# Patient Record
Sex: Female | Born: 1960 | Hispanic: No | Smoking: Never smoker
Health system: Southern US, Community
[De-identification: ages and names within clinical notes are randomized; demographics above are authoritative.]

---

## 2020-06-10 ENCOUNTER — Other Ambulatory Visit: Payer: Self-pay

## 2020-06-10 ENCOUNTER — Ambulatory Visit (INDEPENDENT_AMBULATORY_CARE_PROVIDER_SITE_OTHER): Payer: PRIVATE HEALTH INSURANCE | Admitting: Nurse Practitioner

## 2020-06-10 ENCOUNTER — Encounter: Payer: Self-pay | Admitting: Nurse Practitioner

## 2020-06-10 VITALS — BP 120/71 | HR 79 | Temp 98.4°F | Ht 70.0 in | Wt 203.3 lb

## 2020-06-10 DIAGNOSIS — H1013 Acute atopic conjunctivitis, bilateral: Secondary | ICD-10-CM | POA: Diagnosis not present

## 2020-06-10 DIAGNOSIS — Z7689 Persons encountering health services in other specified circumstances: Secondary | ICD-10-CM | POA: Diagnosis not present

## 2020-06-10 DIAGNOSIS — J301 Allergic rhinitis due to pollen: Secondary | ICD-10-CM

## 2020-06-10 NOTE — Progress Notes (Signed)
New Patient Office Visit  Subjective:  Patient ID: Angela Bartlett, female    DOB: 1960-07-25  Age: 60 y.o. MRN: [id number]  CC:  Chief Complaint  Patient presents with  . New Patient (Initial Visit)    HPI Angela Bartlett presents to establish new primary care provider. She is new to the area. She has moved from [place], [place] in October, 2021. She states that she did have a primary care provider in [place]. She generally has her wellness visit and routine labs done in February. She states that her last pap was in 09/2019 and was normal. She declines screening for colon and breast cancer at this time.  The patient states that she has been having sinus congestion, with a full feeling in the ears without pain. She states that her eyes are very itchy and water a great deal. She states that she saw an herbalist recently. Started her on supplement to help control allergy symptoms. She states that she started this today. Feels like she should notice a difference in the next several days. Offered her allergy eye drops and oral treatment for seasonal allergies. She declines these medications at this time. She denies fever, chills, body aches, or unusual fatigue.  The patient denies chest pain, chest pressure, or shortness of breath. She reports intermittent cough. This is likely due to post nasal drip from allergies.  She denies changes in bowel or bladder habits.  She is due to have regular physical and routine, fasting labs. Will need to get medical records from PCP in [place] to review.    History reviewed. No pertinent past medical history.  History reviewed. No pertinent surgical history.  History reviewed. No pertinent family history.  Social History   Socioeconomic History  . Marital status: Married    Spouse name: Not on file  . Number of children: Not on file  . Years of education: Not on file  . Highest education level: Not on file  Occupational History  . Not on file  Tobacco Use   . Smoking status: Never Smoker  . Smokeless tobacco: Never Used  Substance and Sexual Activity  . Alcohol use: Not Currently  . Drug use: Never  . Sexual activity: Not Currently  Other Topics Concern  . Not on file  Social History Narrative  . Not on file   Social Determinants of Health   Financial Resource Strain: Not on file  Food Insecurity: Not on file  Transportation Needs: Not on file  Physical Activity: Not on file  Stress: Not on file  Social Connections: Not on file  Intimate Partner Violence: Not on file    ROS Review of Systems  Constitutional: Negative for activity change, chills and fever.  HENT: Positive for congestion, rhinorrhea and sinus pressure. Negative for sinus pain.   Eyes: Positive for redness and itching.       Excessive tearing.   Respiratory: Positive for cough. Negative for wheezing.   Cardiovascular: Negative for chest pain and palpitations.  Gastrointestinal: Negative for constipation, nausea and vomiting.  Endocrine: Negative.   Genitourinary: Negative.   Musculoskeletal: Negative for arthralgias, back pain and myalgias.  Skin: Negative for rash.  Allergic/Immunologic: Positive for environmental allergies.  Neurological: Positive for headaches. Negative for dizziness and weakness.  Hematological: Negative for adenopathy.  Psychiatric/Behavioral: Negative.  The patient is not nervous/anxious.   All other systems reviewed and are negative.   Objective:   Today's Vitals   06/10/20 1122  BP: 120/71  Pulse: 79  Temp: 98.4 F (36.9 C)  SpO2: 98%  Weight: 203 lb 4.8 oz (92.2 kg)  Height: 5\' 10"  (1.778 m)   Body mass index is 29.17 kg/m. Physical Exam Vitals and nursing note reviewed.  Constitutional:      Appearance: Normal appearance. She is well-developed.  HENT:     Head: Normocephalic and atraumatic.     Right Ear: Tenderness present. Tympanic membrane is erythematous and bulging.     Left Ear: Tenderness present. Tympanic  membrane is erythematous and bulging.     Ears:     Comments: There is small amount of wax in both outer ear canals.  Eyes:     Pupils: Pupils are equal, round, and reactive to light.     Comments: Inflamed conjunctiva with excess watering of both eyes.   Cardiovascular:     Rate and Rhythm: Normal rate and regular rhythm.     Pulses: Normal pulses.     Heart sounds: Normal heart sounds.  Pulmonary:     Effort: Pulmonary effort is normal.     Breath sounds: Normal breath sounds.  Abdominal:     Palpations: Abdomen is soft.  Musculoskeletal:        General: Normal range of motion.     Cervical back: Normal range of motion and neck supple.  Skin:    General: Skin is warm and dry.     Capillary Refill: Capillary refill takes less than 2 seconds.  Neurological:     General: No focal deficit present.     Mental Status: She is alert and oriented to person, place, and time.  Psychiatric:        Mood and Affect: Mood normal.        Behavior: Behavior normal.        Thought Content: Thought content normal.        Judgment: Judgment normal.     Assessment & Plan:  1. Encounter to establish care Appointment today to establish primary care provider. Will get medical records and lab results from prior PCP to review and update chart.   2. Seasonal allergic rhinitis due to pollen Suggested use of OTC antihistamine such as claritin or zyrtec. Patient plans to give herbal remedy chance. Will consider OTC meds if no improvement over next few days.   3. Allergic conjunctivitis of both eyes Offered prescription for pataday eye drops. Patient declines this for now. Encouraged her to wash face and hair when coming in from prolonged pollen exposure. Avoid touching the eyes until washing hands. Cool, moist compresses can be used to reduce redness and swelling.   Problem List Items Addressed This Visit      Respiratory   Seasonal allergic rhinitis due to pollen     Other   Encounter to  establish care - Primary   Allergic conjunctivitis of both eyes      No outpatient encounter medications on file as of 06/10/2020.   No facility-administered encounter medications on file as of 06/10/2020.    Follow-up: Return in about 4 weeks (around 07/08/2020) for routine physical, fasting blood work a week before. 09/07/2020   Marland Kitchen, NP

## 2020-06-10 NOTE — Patient Instructions (Signed)
Allergic Rhinitis, Adult Allergic rhinitis is a reaction to allergens. Allergens are things that can cause an allergic reaction. This condition affects the lining inside the nose (mucous membrane). There are two types of allergic rhinitis:  Seasonal. This type is also called hay fever. It happens only during some times of the year.  Perennial. This type can happen at any time of the year. This condition cannot be spread from person to person (is not contagious). It can be mild, worse, or very bad. It can develop at any age and may be outgrown. What are the causes? This condition may be caused by:  Pollen from grasses, trees, and weeds.  Dust mites.  Smoke.  Mold.  Car fumes.  The pee (urine), spit, or dander of pets. Dander is dead skin cells from a pet.   What increases the risk? You are more likely to develop this condition if:  You have allergies in your family.  You have problems like allergies in your family. You may have: ? Swelling of parts of your eyes and eyelids. ? Asthma. This affects how you breathe. ? Long-term redness and swelling on your skin. ? Food allergies. What are the signs or symptoms? The main symptom of this condition is a runny or stuffy nose (nasal congestion). Other symptoms may include:  Sneezing or coughing.  Itching and tearing of your eyes.  Mucus that drips down the back of your throat (postnasal drip).  Trouble sleeping.  Feeling tired.  Headache.  Sore throat. How is this treated? There is no cure for this condition. You should avoid things that you are allergic to. Treatment can help to relieve symptoms. This may include:  Medicines that block allergy symptoms, such as corticosteroids or antihistamines. These may be given as a shot, nasal spray, or pill.  Avoiding things you are allergic to.  Medicines that give you bits of what you are allergic to over time. This is called immunotherapy. It is done if other treatments do not  help. You may get: ? Shots. ? Medicine under your tongue.  Stronger medicines, if other treatments do not help. Follow these instructions at home: Avoiding allergens Find out what things you are allergic to and avoid them. To do this, try these things:  If you get allergies any time of year: ? Replace carpet with wood, tile, or vinyl flooring. Carpet can trap pet dander and dust. ? Do not smoke. Do not allow smoking in your home. ? Change your heating and air conditioning filters at least once a month.  If you get allergies only some times of the year: ? Keep windows closed when you can. ? Plan things to do outside when pollen counts are lowest. Check pollen counts before you plan things to do outside. ? When you come indoors, change your clothes and shower before you sit on furniture or bedding.   If you are allergic to a pet: ? Keep the pet out of your bedroom. ? Vacuum, sweep, and dust often.   General instructions  Take over-the-counter and prescription medicines only as told by your doctor.  Drink enough fluid to keep your pee (urine) pale yellow.  Keep all follow-up visits as told by your doctor. This is important. Where to find more information  American Academy of Allergy, Asthma & Immunology: www.aaaai.org Contact a doctor if:  You have a fever.  You get a cough that does not go away.  You make whistling sounds when you breathe (wheeze).  Your   symptoms slow you down.  Your symptoms stop you from doing your normal things each day. Get help right away if:  You are short of breath. This symptom may be an emergency. Do not wait to see if the symptom will go away. Get medical help right away. Call your local emergency services (911 in the [place] Do not drive yourself to the hospital. Summary  Allergic rhinitis may be treated by taking medicines and avoiding things you are allergic to.  If you have allergies only some of the year, keep windows closed when you  can at those times.  Contact your doctor if you get a fever or a cough that does not go away. This information is not intended to replace advice given to you by your health care provider. Make sure you discuss any questions you have with your health care provider. Document Revised: 04/07/2019 Document Reviewed: 02/11/2019 Elsevier Patient Education  2021 [employer].  Allergic Conjunctivitis, Adult  Allergic conjunctivitis is inflammation of the clear membrane (conjunctiva) that covers the white part of your eye and the inner surface of your eyelid. This condition can make your eye red or pink. It can also make your eye feel itchy. This condition cannot be spread from one person to another person (is not contagious). What are the causes? This condition is caused by allergens. These are things that can cause an allergic reaction in some people but not in others. Common allergens include:  Outdoor allergens, such as: ? Pollen, including pollen from grass and weeds. ? Mold. ? Car fumes.  Indoor allergens, such as: ? Dust. ? Smoke. ? Mold. ? Proteins in a pet's pee (urine), saliva, or dander. What increases the risk? You are more likely to develop this condition if you have a family history of these things:  Allergies.  Conditions that you get because of allergens, such as asthma or inflammation of the skin (eczema). What are the signs or symptoms? Symptoms of this condition include eyes that are:  Itchy.  Red.  Watery.  Puffy. Your eyes may also:  Sting or burn.  Have clear fluid draining from them.  Have thick mucus coming from them. How is this treated? This condition may be treated with:  Cold, wet cloths (cold compresses) to soothe itching and swelling.  Washing the face to remove allergens.  Eye drops. These may include: ? Eye drops that block allergies. ? Eye drops that reduce swelling and irritation. ? Steroid eye drops if other treatments have not  worked.  Oral antihistamine medicines. These medicines lessen your allergies. You may need these if eye drops do not help or are difficult to use.   Follow these instructions at home: Eye care  Place a cool, clean washcloth on your eye for 10-20 minutes. Do this 3-4 times a day.  Do not touch or rub your eyes.  Do not wear contact lenses until the inflammation is gone. Wear glasses instead.  Do not wear eye makeup until the inflammation is gone. General instructions  Try not to be around things that you are allergic to.  Take or apply over-the-counter and prescription medicines only as told by your doctor. These include any eye drops.  Drink enough fluid to keep your pee pale yellow.  Keep all follow-up visits as told by your doctor. This is important. Contact a doctor if:  Your symptoms get worse.  Your symptoms do not get better with treatment.  You have mild eye pain.  You are sensitive  to light.  You have spots or blisters on your eyes.  You have pus coming from your eye.  You have a fever. Get help right away if:  You have redness, swelling, or other symptoms in only one eye.  You cannot see well.  You have other vision changes.  You have very bad eye pain. Summary  Allergic conjunctivitis is caused by allergens. It can make your eye red or pink, and it can make your eye feel itchy.  This condition cannot be spread from one person to another person (is not contagious).  Try not to be around things that you are allergic to.  Take or apply over-the-counter and prescription medicines only as told by your doctor. These include any eye drops.  Contact your doctor if your symptoms get worse or they do not get better with treatment. This information is not intended to replace advice given to you by your health care provider. Make sure you discuss any questions you have with your health care provider. Document Revised: 01/06/2019 Document Reviewed:  01/06/2019 Elsevier Patient Education  2021 [employer].

## 2020-07-08 ENCOUNTER — Ambulatory Visit (INDEPENDENT_AMBULATORY_CARE_PROVIDER_SITE_OTHER): Payer: PRIVATE HEALTH INSURANCE | Admitting: Nurse Practitioner

## 2020-07-08 ENCOUNTER — Other Ambulatory Visit: Payer: Self-pay

## 2020-07-08 ENCOUNTER — Encounter: Payer: Self-pay | Admitting: Nurse Practitioner

## 2020-07-08 VITALS — BP 135/70 | HR 70 | Temp 98.0°F | Ht 70.0 in | Wt 207.1 lb

## 2020-07-08 DIAGNOSIS — J301 Allergic rhinitis due to pollen: Secondary | ICD-10-CM

## 2020-07-08 DIAGNOSIS — E559 Vitamin D deficiency, unspecified: Secondary | ICD-10-CM | POA: Diagnosis not present

## 2020-07-08 DIAGNOSIS — R5383 Other fatigue: Secondary | ICD-10-CM

## 2020-07-08 DIAGNOSIS — Z0001 Encounter for general adult medical examination with abnormal findings: Secondary | ICD-10-CM | POA: Diagnosis not present

## 2020-07-08 DIAGNOSIS — Z0000 Encounter for general adult medical examination without abnormal findings: Secondary | ICD-10-CM | POA: Diagnosis not present

## 2020-07-08 NOTE — Patient Instructions (Signed)
Allergies, Adult An allergy means that your body reacts to something that bothers it (allergen). This can happen from something that you eat, breathe in, or touch. Allergies often affect the nose, eyes, skin, and stomach. They can be mild, moderate, or very bad (severe). An allergy cannot spread from person to person. They can happen at any age. Sometimes, people outgrow them. What are the causes?  Outdoor things, such as pollen, car fumes, and mold.  Indoor things, such as dust, smoke, mold, and pets.  Foods.  Medicines.  Things that bother your skin, such as perfume and bug bites. What increases the risk?  Having family members with allergies or asthma. What are the signs or symptoms? Symptoms depend on how bad your allergy is. Mild to moderate symptoms  Runny nose, stuffy nose, or sneezing.  Itchy mouth, ears, or throat.  A feeling of mucus dripping down the back of your throat.  Sore throat.  Eyes that are itchy, red, watery, or puffy.  A skin rash, or red, swollen areas of skin (hives).  Stomach cramps or bloating. Severe symptoms Very bad allergies to food, medicine, or bug bites may cause a very bad allergy reaction (anaphylaxis). This can be life-threatening. Symptoms include:  A red face.  Wheezing or coughing.  Swollen lips, tongue, or mouth.  Tight or swollen throat.  Chest pain or tightness, or a fast heartbeat.  Trouble breathing or shortness of breath.  Pain in your belly (abdomen), vomiting, or watery poop (diarrhea).  Feeling dizzy or fainting. How is this treated? Treatment for this condition depends on your symptoms. Treatment may include:  Cold, wet cloths for itching and swelling.  Eye drops, nose sprays, or skin creams.  Washing out your nose each day.  A humidifier.  Medicines.  A change to the foods you eat.  Being exposed again and again to tiny amounts of allergens. This helps your body get used to them. You might  have: ? Allergy shots. ? Very small amounts of allergen put under your tongue.  An emergency shot (auto-injector pen) if you have a very bad allergy reaction. ? This is a medicine with a needle. You can put it into your skin by yourself. ? Your doctor will teach you how to use it.      Follow these instructions at home: Medicines  Take or apply over-the-counter and prescription medicines only as told by your doctor.  If you are at risk for a very bad allergy reaction, keep an auto-injector pen with you all the time.   Eating and drinking  Follow instructions from your doctor about what to eat and drink.  Drink enough fluid to keep your pee (urine) pale yellow. General instructions  If you have ever had a very bad allergy reaction, wear a medical alert bracelet or necklace.  Stay away from things that you are allergic to.  Keep all follow-up visits as told by your doctor. This is important. Contact a doctor if:  Your symptoms do not get better with treatment. Get help right away if:  You have symptoms of a very bad allergy reaction. These include: ? A swollen mouth, tongue, or throat. ? Pain or tightness in your chest. ? Trouble breathing. ? Being short of breath. ? Dizziness. ? Fainting. ? Very bad pain in your belly. ? Vomiting. ? Watery poop. These symptoms may be an emergency. Do not wait to see if the symptoms will go away. Get medical help right away. Call your local   emergency services (911 in the [place] Do not drive yourself to the hospital. Summary  Take or apply over-the-counter and prescription medicines only as told by your doctor.  Stay away from things you are allergic to.  If you are at risk for a very bad allergy reaction, carry an auto-injector pen all the time.  Wear a medical alert bracelet or necklace.  Very bad allergy reactions can be life-threatening. Get help right away. This information is not intended to replace advice given to you by your  health care provider. Make sure you discuss any questions you have with your health care provider. Document Revised: 12/25/2018 Document Reviewed: 12/25/2018 Elsevier Patient Education  2021 [employer] Inc.  

## 2020-07-08 NOTE — Progress Notes (Signed)
Established Patient Office Visit  Subjective:  Patient ID: Angela Bartlett, female    DOB: 07-07-1960  Age: 60 y.o. MRN: [id number]  CC:  Chief Complaint  Patient presents with  . Annual Exam    HPI Angela Bartlett presents for annual physical. Today, she is c/o nasal allergies. She states that these did get better after her first visit, but have resurfaced. She states that she feels her left ear and left sinuses are congested. She states her throat has been a little scratchy. She denies fever, headache, or chills. She denies nausea, vomiting, or changes in bowel or bladder routines.  She is due to have routine, fasting labs while in the office today.  Will get medical records and progress notes from prior PCP to update her chart and determine needed preventive health screenings.   History reviewed. No pertinent past medical history.  History reviewed. No pertinent surgical history.  History reviewed. No pertinent family history.  Social History   Socioeconomic History  . Marital status: Married    Spouse name: Not on file  . Number of children: Not on file  . Years of education: Not on file  . Highest education level: Not on file  Occupational History  . Not on file  Tobacco Use  . Smoking status: Never Smoker  . Smokeless tobacco: Never Used  Substance and Sexual Activity  . Alcohol use: Not Currently  . Drug use: Never  . Sexual activity: Not Currently  Other Topics Concern  . Not on file  Social History Narrative  . Not on file   Social Determinants of Health   Financial Resource Strain: Not on file  Food Insecurity: Not on file  Transportation Needs: Not on file  Physical Activity: Not on file  Stress: Not on file  Social Connections: Not on file  Intimate Partner Violence: Not on file    No outpatient medications prior to visit.   No facility-administered medications prior to visit.    Allergies  Allergen Reactions  . Iodine   . Latex      ROS Review of Systems  Constitutional: Negative for activity change, chills and fever.  HENT: Positive for congestion, postnasal drip, rhinorrhea and sinus pressure. Negative for sinus pain.   Eyes: Positive for itching.  Respiratory: Negative for cough, chest tightness, shortness of breath and wheezing.   Cardiovascular: Negative for chest pain and palpitations.  Gastrointestinal: Negative for constipation, diarrhea, nausea and vomiting.  Endocrine: Negative.   Genitourinary: Negative for dysuria, frequency and urgency.  Musculoskeletal: Negative for back pain and myalgias.  Skin: Negative for rash.  Allergic/Immunologic: Positive for environmental allergies.  Neurological: Negative for dizziness, weakness and headaches.  Hematological: Negative.   Psychiatric/Behavioral: The patient is not nervous/anxious.   All other systems reviewed and are negative.     Objective:    Physical Exam Vitals and nursing note reviewed.  Constitutional:      Appearance: Normal appearance. She is well-developed.  HENT:     Head: Normocephalic and atraumatic.     Right Ear: Swelling present. Tympanic membrane is bulging.     Left Ear: Swelling present. Tympanic membrane is bulging.     Nose: Congestion present.     Mouth/Throat:     Pharynx: Posterior oropharyngeal erythema present.  Eyes:     Pupils: Pupils are equal, round, and reactive to light.  Cardiovascular:     Rate and Rhythm: Normal rate and regular rhythm.     Pulses: Normal pulses.  Heart sounds: Normal heart sounds.  Pulmonary:     Effort: Pulmonary effort is normal.     Breath sounds: Normal breath sounds.  Chest:  Breasts:     Right: Normal. No swelling, bleeding, inverted nipple, mass, nipple discharge, skin change, tenderness or axillary adenopathy.     Left: Normal. No swelling, bleeding, inverted nipple, mass, nipple discharge, skin change, tenderness or axillary adenopathy.    Abdominal:     General: Bowel  sounds are normal.     Palpations: Abdomen is soft.     Tenderness: There is no abdominal tenderness.  Musculoskeletal:        General: Normal range of motion.     Cervical back: Normal range of motion and neck supple.  Lymphadenopathy:     Cervical: No cervical adenopathy.     Upper Body:     Right upper body: No axillary adenopathy.     Left upper body: No axillary adenopathy.  Skin:    General: Skin is warm and dry.     Capillary Refill: Capillary refill takes less than 2 seconds.  Neurological:     General: No focal deficit present.     Mental Status: She is alert and oriented to person, place, and time.  Psychiatric:        Mood and Affect: Mood normal.        Thought Content: Thought content normal.        Judgment: Judgment normal.     Today's Vitals   07/08/20 1012  BP: 135/70  Pulse: 70  Temp: 98 F (36.7 C)  SpO2: 100%  Weight: 207 lb 1.6 oz (93.9 kg)  Height: '5\' 10"'  (1.778 m)   Body mass index is 29.72 kg/m.   Wt Readings from Last 3 Encounters:  07/08/20 207 lb 1.6 oz (93.9 kg)  06/10/20 203 lb 4.8 oz (92.2 kg)     Health Maintenance Due  Topic Date Due  . Hepatitis C Screening  Never done  . PAP SMEAR-Modifier  Never done    There are no preventive care reminders to display for this patient.  No results found for: TSH No results found for: WBC, HGB, HCT, MCV, PLT No results found for: NA, K, CHLORIDE, CO2, GLUCOSE, BUN, CREATININE, BILITOT, ALKPHOS, AST, ALT, PROT, ALBUMIN, CALCIUM, ANIONGAP, EGFR, GFR No results found for: CHOL No results found for: HDL No results found for: LDLCALC No results found for: TRIG No results found for: CHOLHDL No results found for: HGBA1C    Assessment & Plan:  1. Encounter for general adult medical examination with abnormal findings Annual physical today. Routine, fasting labs drawn in house. Will get progress notes and test results from prior pcp to determine need for additional preventive tests.   2.  Seasonal allergic rhinitis due to pollen Recommend OTC claritin or zyrtec for short time due to high levels of pollen, locally. Continue to use saline rinse and natural/herbal remedies as needed and as indicated.   3. Healthcare maintenance Routine, fasting labs drawn while in office today - CBC with Differential/Platelet - Comprehensive metabolic panel - T4, free - TSH - Lipid panel  4. Vitamin D deficiency Check vitamin d level and treat as indicated  - Vitamin D 1,25 dihydroxy  5. Other fatigue Check thyroid panel with routine labs.  - T4, free - TSH  Problem List Items Addressed This Visit      Respiratory   Seasonal allergic rhinitis due to pollen     Other  Encounter for general adult medical examination with abnormal findings - Primary   Healthcare maintenance   Relevant Orders   CBC with Differential/Platelet   Comprehensive metabolic panel   T4, free   TSH   Lipid panel   Vitamin D deficiency   Relevant Orders   Vitamin D 1,25 dihydroxy   Other fatigue   Relevant Orders   T4, free   TSH       Follow-up: Return in about 1 year (around 07/08/2021) for physical - we need to get medical records and preventive tests from provider in [place]. thanks. Marland Kitchen    Ronnell Freshwater, NP

## 2020-07-16 ENCOUNTER — Ambulatory Visit
Admission: EM | Admit: 2020-07-16 | Discharge: 2020-07-16 | Disposition: A | Discharge status: Home / Self Care | Payer: PRIVATE HEALTH INSURANCE

## 2020-07-16 ENCOUNTER — Telehealth: Payer: Self-pay | Admitting: Nurse Practitioner

## 2020-07-16 ENCOUNTER — Other Ambulatory Visit: Payer: Self-pay

## 2020-07-16 DIAGNOSIS — M25461 Effusion, right knee: Secondary | ICD-10-CM

## 2020-07-16 DIAGNOSIS — I83891 Varicose veins of right lower extremities with other complications: Secondary | ICD-10-CM

## 2020-07-16 NOTE — ED Triage Notes (Signed)
Pt c/o swelling to rt knee for a month after starting a new job and standing 8hrs. States has went part time but still swelling. Denies injury.

## 2020-07-16 NOTE — Telephone Encounter (Signed)
Patient going to urgent care.

## 2020-07-16 NOTE — Telephone Encounter (Signed)
Patient has swelling in her right knee. No pain, just feels heavy. Please advise, thanks.

## 2020-07-16 NOTE — Discharge Instructions (Signed)
You can try using the Ace wrap or purchasing a larger knee sleeve however I do recommend taking over-the-counter naproxen which is an anti-inflammatory which can help with inflammation and swelling of the knee.  However recommend using either the Ace wrap or knee sleeve during work hours and prolonged standing to improve venous return and reduce the risk of swelling.Marland Kitchen

## 2020-07-16 NOTE — ED Provider Notes (Signed)
[facility]    CSN: [id number] Arrival date & time: 07/16/20  1645      History   Chief Complaint No chief complaint on file.   HPI Angela Bartlett is a 60 y.o. female.   HPI  Patient presents today with 1 month of right knee swelling.  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 She endorses some occasional pain however mostly in the posterior patellar region which radiates to the upper lower leg.  No overt patellar tendon MCL or ACL pain.  She has been wearing a ill fitting knee brace however only wearing it after her work shifts.  She also has obvious varicosities involving both legs.  However she is not having any swelling involving the left knee.  She has not tried any NSAIDs she takes turmeric routinely. Denies any known injury. No past medical history on file.  There are no problems to display for this patient.    OB History   No obstetric history on file.      Home Medications    Prior to Admission medications   Not on File    Family History No family history on file.  Social History     Allergies   Patient has no allergy information on record.   Review of Systems Review of Systems Pertinent negatives listed in HPI  Physical Exam Triage Vital Signs ED Triage Vitals  Enc Vitals Group     BP      Pulse      Resp      Temp      Temp src      SpO2      Weight      Height      Head Circumference      Peak Flow      Pain Score      Pain Loc      Pain Edu?      Excl. in GC?    No data found.  Updated Vital Signs There were no vitals taken for this visit.  Visual Acuity Right Eye Distance:   Left Eye Distance:   Bilateral Distance:    Right Eye Near:   Left Eye Near:    Bilateral Near:     Physical Exam General appearance: Alert, well developed, well nourished, cooperative  Head:  Normocephalic, without obvious abnormality, atraumatic Respiratory: Respirations even and unlabored, normal respiratory rate Heart: rate and rhythm normal. No gallop or murmurs noted on exam  Abdomen: BS +, no distention, no rebound tenderness, or no mass Extremities: Righrt knee trace swelling, no effusion full range of motion, no gross deformities Skin: Bilateral lower extremity varicosities present, skin color, texture, turgor normal. No rashes seen  Psych: Appropriate mood and affect. Neurologic: GCS 15, normal coordination normal gait  UC Treatments / Results  Labs (all labs ordered are listed, but only abnormal results are displayed) Labs Reviewed - No data to display  EKG   Radiology No results found.  Procedures Procedures (including critical care time)  Medications Ordered in UC Medications - No data to display  Initial Impression / Assessment and Plan / UC Course  I have reviewed the triage vital signs and the nursing notes.  Pertinent labs & imaging results that were available during my care of the patient were reviewed by me and considered  in my medical decision making (see chart for details).    Swelling of the right knee joint suspect related to venous insufficiency due to diffuse varicosities present on bilateral legs. Recommend wearing a knee brace during work hours also recommend compression stockings or socks while working given that she standing on her feet for prolonged periods of time throughout the day.  She has no knee pain.  However is concerned regarding the knee swelling.  Advised she could try NSAIDs such as Aleve if pain develops.   Information to follow-up with sports medicine if symptoms persist or if any pain develops. Final Clinical Impressions(s) / UC Diagnoses   Final diagnoses:  Swelling of joint of right knee  Varicose veins of leg with swelling, right     Discharge Instructions     You can try using the Ace wrap or purchasing a larger  knee sleeve however I do recommend taking over-the-counter naproxen which is an anti-inflammatory which can help with inflammation and swelling of the knee.  However recommend using either the Ace wrap or knee sleeve during work hours and prolonged standing to improve venous return and reduce the risk of swelling..    ED Prescriptions    None     PDMP not reviewed this encounter.   Bing Neighbors, [place] 07/20/20 [phone]

## 2020-07-16 NOTE — Telephone Encounter (Signed)
Please contact patient to schedule apt to discuss issue. If patient unable to wait until next available apt please advise to go to UC. AS, CMA

## 2020-07-17 LAB — LIPID PANEL
Chol/HDL Ratio: 3.1 ratio (ref 0.0–4.4)
Cholesterol, Total: 231 mg/dL — ABNORMAL HIGH (ref 100–199)
HDL: 74 mg/dL (ref >39)
LDL Chol Calc (NIH): 143 mg/dL — ABNORMAL HIGH (ref 0–99)
Triglycerides: 83 mg/dL (ref 0–149)
VLDL Cholesterol Cal: 14 mg/dL (ref 5–40)

## 2020-07-17 LAB — CBC WITH DIFFERENTIAL/PLATELET
Basophils Absolute: 0 10*3/uL (ref 0.0–0.2)
Basos: 1 %
EOS (ABSOLUTE): 0.2 10*3/uL (ref 0.0–0.4)
Eos: 2 %
Hematocrit: 37.8 % (ref 34.0–46.6)
Hemoglobin: 12.6 g/dL (ref 11.1–15.9)
Immature Grans (Abs): 0 10*3/uL (ref 0.0–0.1)
Immature Granulocytes: 0 %
Lymphocytes Absolute: 1.9 10*3/uL (ref 0.7–3.1)
Lymphs: 30 %
MCH: 29.4 pg (ref 26.6–33.0)
MCHC: 33.3 g/dL (ref 31.5–35.7)
MCV: 88 fL (ref 79–97)
Monocytes Absolute: 0.5 10*3/uL (ref 0.1–0.9)
Monocytes: 7 %
Neutrophils Absolute: 3.6 10*3/uL (ref 1.4–7.0)
Neutrophils: 60 %
Platelets: 223 10*3/uL (ref 150–450)
RBC: 4.29 x10E6/uL (ref 3.77–5.28)
RDW: 14.2 % (ref 11.7–15.4)
WBC: 6.2 10*3/uL (ref 3.4–10.8)

## 2020-07-17 LAB — COMPREHENSIVE METABOLIC PANEL
ALT: 19 IU/L (ref 0–32)
AST: 25 IU/L (ref 0–40)
Albumin/Globulin Ratio: 1.6 (ref 1.2–2.2)
Albumin: 4.5 g/dL (ref 3.8–4.9)
Alkaline Phosphatase: 110 IU/L (ref 44–121)
BUN/Creatinine Ratio: 12 (ref 9–23)
BUN: 11 mg/dL (ref 6–24)
Bilirubin Total: 0.8 mg/dL (ref 0.0–1.2)
CO2: 24 mmol/L (ref 20–29)
Calcium: 9.2 mg/dL (ref 8.7–10.2)
Chloride: 104 mmol/L (ref 96–106)
Creatinine, Ser: 0.91 mg/dL (ref 0.57–1.00)
Globulin, Total: 2.9 g/dL (ref 1.5–4.5)
Glucose: 84 mg/dL (ref 65–99)
Potassium: 4.1 mmol/L (ref 3.5–5.2)
Sodium: 142 mmol/L (ref 134–144)
Total Protein: 7.4 g/dL (ref 6.0–8.5)
eGFR: 73 mL/min/{1.73_m2} (ref >59)

## 2020-07-17 LAB — T4, FREE: Free T4: 1.28 ng/dL (ref 0.82–1.77)

## 2020-07-17 LAB — TSH: TSH: 0.817 u[IU]/mL (ref 0.450–4.500)

## 2020-07-17 LAB — VITAMIN D 1,25 DIHYDROXY
Vitamin D 1, 25 (OH)2 Total: 75 pg/mL — ABNORMAL HIGH
Vitamin D2 1, 25 (OH)2: <10 pg/mL
Vitamin D3 1, 25 (OH)2: 73 pg/mL

## 2020-07-19 ENCOUNTER — Encounter: Payer: Self-pay | Admitting: Nurse Practitioner

## 2020-07-27 ENCOUNTER — Ambulatory Visit: Payer: Self-pay | Admitting: Internal Medicine

## 2020-07-27 ENCOUNTER — Telehealth: Payer: Self-pay | Admitting: Nurse Practitioner

## 2020-07-27 NOTE — Telephone Encounter (Signed)
error 

## 2020-08-03 ENCOUNTER — Telehealth: Payer: Self-pay | Admitting: Nurse Practitioner

## 2020-08-03 NOTE — Telephone Encounter (Signed)
Please review patients labs and advise. Patient upset that no one has contacted her about lab results. AS, CMA

## 2020-08-03 NOTE — Progress Notes (Signed)
Bad and total cholesterol just a little elevated. Patient should limit fried and fatty foods in her diet and increase exercise. Labs were otherwise good. Patient to be notified.

## 2020-08-03 NOTE — Telephone Encounter (Signed)
Patient is aware of the results and verbalized understanding. AS, CMA 

## 2020-08-03 NOTE — Telephone Encounter (Signed)
I apologize. I just reviewed her labs. Her bad and total cholesterol were just a little high. She should limit the amount of fried and fatty foods in her diet and increase the amount of daily physical activity. Otherwise, all labs were good. Thanks. Again, I apologize. You can let her know I have not been at 100% if that helps. Thanks.

## 2020-10-26 ENCOUNTER — Ambulatory Visit (INDEPENDENT_AMBULATORY_CARE_PROVIDER_SITE_OTHER): Payer: PRIVATE HEALTH INSURANCE | Admitting: Nurse Practitioner

## 2020-10-26 ENCOUNTER — Encounter: Payer: Self-pay | Admitting: Nurse Practitioner

## 2020-10-26 VITALS — Ht 70.0 in | Wt 207.0 lb

## 2020-10-26 DIAGNOSIS — J0110 Acute frontal sinusitis, unspecified: Secondary | ICD-10-CM

## 2020-10-26 MED ORDER — AZITHROMYCIN 250 MG PO TABS
ORAL_TABLET | ORAL | 0 refills | Status: DC
Start: 2020-10-26 — End: 2020-11-18

## 2020-10-26 NOTE — Progress Notes (Signed)
Virtual Visit via Telephone Note  I connected with Angela Bartlett on 11/07/20 at  3:30 PM EDT by telephone and verified that I am speaking with the correct person using two identifiers.  Location: Patient: home Provider: [place] [facility]     I discussed the limitations, risks, security and privacy concerns of performing an evaluation and management service by telephone and the availability of in person appointments. I also discussed with the patient that there may be a patient responsible charge related to this service. The patient expressed understanding and agreed to proceed.   History of Present Illness: The patient states that she has been experiencing cough for approximately two weeks.  She states she feels swelling in the left side of her throat.  She has developed pressure in the left side of her head.  She states if she turns her head fast she feels increased pain in left ear and left frontal sinus area.  She states she has been using herbal remedies to help with mucus.  She is actually doubled up on dosing for the short period.  She states that mucus production is worse at night.  She states symptoms have gradually been getting worse.  She denies fever or headache.  She states she will take a home COVID-19 test and notify us of results once they are available.  She denies nausea, vomiting, or diarrhea.  She denies body aches or chills.   Observations/Objective:  The patient is alert and oriented. She is pleasant and answers all questions appropriately. Breathing is non-labored. She is in no acute distress at this time.  She sounds nasally congested.  There is a loose sounding cough which can be heard during today's telephone call.  Assessment and Plan: 1. Acute non-recurrent frontal sinusitis Start Z-Pak.  Take as directed for 5 days.  Recommend she rest and increase fluids.  Take over-the-counter medications as needed and as indicated to alleviate acute symptoms.   She plans to take a home COVID-19 test.  She will notify the office of results once they are available.  We will adjust treatment based on those test results. - azithromycin (ZITHROMAX) 250 MG tablet; z-pack - take as directed for 5 days  Dispense: 6 tablet; Refill: 0   Follow Up Instructions:    I discussed the assessment and treatment plan with the patient. The patient was provided an opportunity to ask questions and all were answered. The patient agreed with the plan and demonstrated an understanding of the instructions.   The patient was advised to call back or seek an in-person evaluation if the symptoms worsen or if the condition fails to improve as anticipated.  I provided 15 minutes of non-face-to-face time during this encounter.  This note was dictated using Conservation officer, historic buildings. Rapid proofreading was performed to expedite the delivery of the information. Despite proofreading, phonetic errors will occur which are common with this voice recognition software. Please take this into consideration. If there are any concerns, please contact our office.    Carlean Jews, NP

## 2020-10-27 ENCOUNTER — Telehealth: Payer: Self-pay | Admitting: Nurse Practitioner

## 2020-10-27 DIAGNOSIS — J0110 Acute frontal sinusitis, unspecified: Secondary | ICD-10-CM | POA: Insufficient documentation

## 2020-10-27 NOTE — Telephone Encounter (Signed)
Patient's covid test was negative and has a slight fever. (101) No other symptoms. Please advise, thanks.

## 2020-10-27 NOTE — Telephone Encounter (Signed)
Call patient she is advised to continued the z pak and Tylenol

## 2020-10-27 NOTE — Telephone Encounter (Signed)
What is patients temp?  I would suggest scheduling an appointment with provider to discuss concern.

## 2020-11-18 ENCOUNTER — Ambulatory Visit (INDEPENDENT_AMBULATORY_CARE_PROVIDER_SITE_OTHER): Payer: PRIVATE HEALTH INSURANCE | Admitting: Nurse Practitioner

## 2020-11-18 ENCOUNTER — Encounter: Payer: Self-pay | Admitting: Nurse Practitioner

## 2020-11-18 ENCOUNTER — Other Ambulatory Visit: Payer: Self-pay

## 2020-11-18 DIAGNOSIS — J0110 Acute frontal sinusitis, unspecified: Secondary | ICD-10-CM | POA: Diagnosis not present

## 2020-11-18 MED ORDER — AZITHROMYCIN 250 MG PO TABS: ORAL_TABLET | ORAL | 0 refills | Status: DC

## 2020-11-18 NOTE — Progress Notes (Signed)
Acute Office Visit  Subjective:    Patient ID: Angela Bartlett, female    DOB: 1961/01/07, 60 y.o.   MRN: [id number]  Chief Complaint  Patient presents with   Sore Throat    The patient did have a phone visit on 10/26/2020 due to similar symptoms. She wwas treated with Z-pack. She took the first three days, but did not finish the treatment plan. She had flown out on trip and forgot to take this with her. She states that she had initially been feeling better. Symptoms restarted about 1 week ago.   Sore Throat  This is a recurrent problem. The current episode started in the past 7 days. The problem has been gradually worsening. The pain is worse on the left side. There has been no fever. The pain is mild. Associated symptoms include congestion, coughing, headaches, a hoarse voice and trouble swallowing. Pertinent negatives include no abdominal pain, diarrhea, ear pain, plugged ear sensation, shortness of breath or vomiting. She has had no exposure to strep or mono.    History reviewed. No pertinent past medical history.  History reviewed. No pertinent surgical history.  History reviewed. No pertinent family history.  Social History   Socioeconomic History   Marital status: Married    Spouse name: Not on file   Number of children: Not on file   Years of education: Not on file   Highest education level: Not on file  Occupational History   Not on file  Tobacco Use   Smoking status: Never   Smokeless tobacco: Never  Substance and Sexual Activity   Alcohol use: Not Currently   Drug use: Not Currently   Sexual activity: Not Currently  Other Topics Concern   Not on file  Social History Narrative   ** Merged History Encounter **       Social Determinants of Health   Financial Resource Strain: Not on file  Food Insecurity: Not on file  Transportation Needs: Not on file  Physical Activity: Not on file  Stress: Not on file  Social Connections: Not on file  Intimate Partner  Violence: Not on file    Outpatient Medications Prior to Visit  Medication Sig Dispense Refill   azithromycin (ZITHROMAX) 250 MG tablet z-pack - take as directed for 5 days 6 tablet 0   No facility-administered medications prior to visit.    Allergies  Allergen Reactions   Iodine    Latex     Review of Systems  Constitutional:  Positive for fatigue. Negative for activity change, appetite change, chills and fever.  HENT:  Positive for congestion, hoarse voice, postnasal drip, rhinorrhea, sore throat and trouble swallowing. Negative for ear pain, sinus pressure, sinus pain and sneezing.   Eyes: Negative.   Respiratory:  Positive for cough. Negative for chest tightness, shortness of breath and wheezing.   Cardiovascular:  Negative for chest pain and palpitations.  Gastrointestinal:  Negative for abdominal pain, constipation, diarrhea, nausea and vomiting.  Endocrine: Negative for cold intolerance, heat intolerance, polydipsia and polyuria.  Genitourinary:  Negative for dyspareunia, dysuria, flank pain, frequency and urgency.  Musculoskeletal:  Negative for arthralgias, back pain and myalgias.  Skin:  Negative for rash.  Allergic/Immunologic: Negative for environmental allergies.  Neurological:  Positive for headaches. Negative for dizziness and weakness.  Hematological:  Positive for adenopathy.  Psychiatric/Behavioral:  The patient is not nervous/anxious.       Objective:    Physical Exam Vitals and nursing note reviewed.  Constitutional:  Appearance: Normal appearance. She is well-developed. She is ill-appearing.  HENT:     Head: Normocephalic and atraumatic.     Right Ear: Tympanic membrane, ear canal and external ear normal.     Left Ear: Tympanic membrane, ear canal and external ear normal.     Nose: Congestion present.     Right Sinus: Frontal sinus tenderness present.     Left Sinus: Frontal sinus tenderness present.     Mouth/Throat:     Pharynx: Posterior  oropharyngeal erythema present.  Eyes:     Pupils: Pupils are equal, round, and reactive to light.  Cardiovascular:     Rate and Rhythm: Normal rate and regular rhythm.     Pulses: Normal pulses.     Heart sounds: Normal heart sounds.  Pulmonary:     Effort: Pulmonary effort is normal.     Breath sounds: Normal breath sounds.  Abdominal:     Palpations: Abdomen is soft.  Musculoskeletal:        General: Normal range of motion.     Cervical back: Normal range of motion and neck supple.  Lymphadenopathy:     Cervical: Cervical adenopathy present.  Skin:    General: Skin is warm and dry.     Capillary Refill: Capillary refill takes less than 2 seconds.  Neurological:     General: No focal deficit present.     Mental Status: She is alert and oriented to person, place, and time.  Psychiatric:        Mood and Affect: Mood normal.        Behavior: Behavior normal.        Thought Content: Thought content normal.        Judgment: Judgment normal.    Today's Vitals   11/18/20 1405 11/18/20 1436  BP: (!) 146/74 124/67  Pulse: 69   Temp: 98.1 F (36.7 C)   SpO2: 99%   Weight: 217 lb 8 oz (98.7 kg)   Height: _0  (1.778 m)    Body mass index is 31.21 kg/m.   Wt Readings from Last 3 Encounters:  11/18/20 217 lb 8 oz (98.7 kg)  10/26/20 207 lb (93.9 kg)  07/08/20 207 lb 1.6 oz (93.9 kg)    Health Maintenance Due  Topic Date Due   COVID-19 Vaccine (1) Never done   Hepatitis C Screening  Never done   PAP SMEAR-Modifier  Never done   Zoster Vaccines- Shingrix (1 of 2) Never done   INFLUENZA VACCINE  Never done    There are no preventive care reminders to display for this patient.   Lab Results  Component Value Date   TSH 0.817 07/08/2020   Lab Results  Component Value Date   WBC 6.2 07/08/2020   HGB 12.6 07/08/2020   HCT 37.8 07/08/2020   MCV 88 07/08/2020   PLT 223 07/08/2020   Lab Results  Component Value Date   NA 142 07/08/2020   K 4.1 07/08/2020   CO2  24 07/08/2020   GLUCOSE 84 07/08/2020   BUN 11 07/08/2020   CREATININE 0.91 07/08/2020   BILITOT 0.8 07/08/2020   ALKPHOS 110 07/08/2020   AST 25 07/08/2020   ALT 19 07/08/2020   PROT 7.4 07/08/2020   ALBUMIN 4.5 07/08/2020   CALCIUM 9.2 07/08/2020   EGFR 73 07/08/2020   Lab Results  Component Value Date   CHOL 231 (H) 07/08/2020   Lab Results  Component Value Date   HDL 74 07/08/2020  Lab Results  Component Value Date   LDLCALC 143 (H) 07/08/2020   Lab Results  Component Value Date   TRIG 83 07/08/2020   Lab Results  Component Value Date   CHOLHDL 3.1 07/08/2020   No results found for: HGBA1C     Assessment & Plan:  1. Acute non-recurrent frontal sinusitis Start Z-Pak.  Take as directed for 5 days. Rest and increase fluids. Continue using OTC medication to control symptoms.   - azithromycin (ZITHROMAX) 250 MG tablet; z-pack - take as directed for 5 days  Dispense: 6 tablet; Refill: 0   Problem List Items Addressed This Visit       Respiratory   Acute non-recurrent frontal sinusitis   Relevant Medications   azithromycin (ZITHROMAX) 250 MG tablet     Meds ordered this encounter  Medications   azithromycin (ZITHROMAX) 250 MG tablet    Sig: z-pack - take as directed for 5 days    Dispense:  6 tablet    Refill:  0    Order Specific Question:   Supervising Provider    Answer:   Beatrice Lecher D [[id number]]   This note was dictated using Dragon Voice Recognition Software. Rapid proofreading was performed to expedite the delivery of the information. Despite proofreading, phonetic errors will occur which are common with this voice recognition software. Please take this into consideration. If there are any concerns, please contact our office.     Ronnell Freshwater, NP

## 2020-12-09 ENCOUNTER — Ambulatory Visit: Payer: PRIVATE HEALTH INSURANCE | Admitting: Nurse Practitioner

## 2020-12-09 ENCOUNTER — Ambulatory Visit
Admission: RE | Admit: 2020-12-09 | Discharge: 2020-12-09 | Disposition: A | Discharge status: Home / Self Care | Payer: PRIVATE HEALTH INSURANCE | Source: Ambulatory Visit | Attending: Nurse Practitioner | Admitting: Nurse Practitioner

## 2020-12-09 ENCOUNTER — Ambulatory Visit (INDEPENDENT_AMBULATORY_CARE_PROVIDER_SITE_OTHER): Payer: PRIVATE HEALTH INSURANCE | Admitting: Nurse Practitioner

## 2020-12-09 ENCOUNTER — Other Ambulatory Visit: Payer: Self-pay

## 2020-12-09 ENCOUNTER — Encounter: Payer: Self-pay | Admitting: Nurse Practitioner

## 2020-12-09 VITALS — BP 128/66 | HR 68 | Temp 98.1°F | Ht 70.0 in | Wt 200.2 lb

## 2020-12-09 DIAGNOSIS — M25561 Pain in right knee: Secondary | ICD-10-CM

## 2020-12-09 NOTE — Progress Notes (Signed)
Acute Office Visit  Subjective:    Patient ID: Angela Bartlett, female    DOB: [birth year], 60 y.o.   MRN: [id number]  Chief Complaint  Patient presents with   Knee Pain    The patient states that about 4 weeks ago, she was running in heels to get to appointment on time. Twisted right ankle. Since hen, ankle has not bothered her, but right knee gets stiff and tight. She states that she feels popping in the joint when she bends and straightening the knee.   Knee Pain  The incident occurred more than 1 week ago. Incident location: patinet was out of town wiht a friend and running to an appointment. she twisted her right ankle. had no problems wiht the ankle, but right knee started to hurt after that. Injury mechanism: twisting of the right ankle. The pain is present in the right knee. The quality of the pain is described as aching. Pain scale: no pain, just fels heavy when it gets swollen. Pain course: pain/tightness is intermittent but steadily improving. Associated symptoms include muscle weakness. She reports no foreign bodies present. The symptoms are aggravated by weight bearing and movement (patient states that sometimes pain is better when she walkes, and sometimes it is better when she is still.). She has tried heat and ice (patient taking tumeric and garlic, pienapple juice.) for the symptoms. The treatment provided mild relief.    History reviewed. No pertinent past medical history.  History reviewed. No pertinent surgical history.  History reviewed. No pertinent family history.  Social History   Socioeconomic History   Marital status: Married    Spouse name: Not on file   Number of children: Not on file   Years of education: Not on file   Highest education level: Not on file  Occupational History   Not on file  Tobacco Use   Smoking status: Never   Smokeless tobacco: Never  Substance and Sexual Activity   Alcohol use: Not Currently   Drug use: Not Currently   Sexual  activity: Not Currently  Other Topics Concern   Not on file  Social History Narrative   ** Merged History Encounter **       Social Determinants of Health   Financial Resource Strain: Not on file  Food Insecurity: Not on file  Transportation Needs: Not on file  Physical Activity: Not on file  Stress: Not on file  Social Connections: Not on file  Intimate Partner Violence: Not on file    Outpatient Medications Prior to Visit  Medication Sig Dispense Refill   azithromycin (ZITHROMAX) 250 MG tablet z-pack - take as directed for 5 days 6 tablet 0   No facility-administered medications prior to visit.    Allergies  Allergen Reactions   Iodine    Latex     Review of Systems  Constitutional:  Positive for activity change. Negative for appetite change, chills, fatigue and fever.       Decreased activity due to pain; tightness of right knee   HENT:  Negative for congestion, postnasal drip, rhinorrhea, sinus pressure, sinus pain, sneezing and sore throat.   Eyes: Negative.   Respiratory:  Negative for cough, chest tightness, shortness of breath and wheezing.   Cardiovascular:  Negative for chest pain and palpitations.  Gastrointestinal:  Negative for abdominal pain, constipation, diarrhea, nausea and vomiting.  Endocrine: Negative for cold intolerance, heat intolerance, polydipsia and polyuria.  Genitourinary:  Negative for dyspareunia, dysuria, flank pain, frequency and urgency.  Musculoskeletal:  Positive for arthralgias and joint swelling. Negative for back pain and myalgias.       Pain and tightness of the right knee. Has been going on for about 1 month. Started after she twisted her right ankle. Knee is feeling tight and tender. Has been resting, icing, applying heat, and using bandage to provide support when at work. She states that knee is slowly getting better. Does feel popping when she bends and straightens the knee.   Skin:  Negative for rash.  Allergic/Immunologic:  Negative for environmental allergies.  Neurological:  Negative for dizziness, weakness and headaches.  Hematological:  Negative for adenopathy.  Psychiatric/Behavioral:  The patient is not nervous/anxious.       Objective:    Physical Exam Vitals and nursing note reviewed.  Constitutional:      Appearance: Normal appearance. She is well-developed. She is obese.  HENT:     Head: Normocephalic and atraumatic.  Eyes:     Extraocular Movements: Extraocular movements intact.     Conjunctiva/sclera: Conjunctivae normal.     Pupils: Pupils are equal, round, and reactive to light.  Cardiovascular:     Rate and Rhythm: Normal rate and regular rhythm.     Pulses: Normal pulses.     Heart sounds: Normal heart sounds.  Pulmonary:     Effort: Pulmonary effort is normal.     Breath sounds: Normal breath sounds.  Abdominal:     Palpations: Abdomen is soft.  Musculoskeletal:     Cervical back: Normal range of motion and neck supple.     Right knee: Swelling and crepitus present. Decreased range of motion. Tenderness present over the medial joint line, LCL and ACL. MCL laxity and ACL laxity present.     Left knee: Normal.  Lymphadenopathy:     Cervical: No cervical adenopathy.  Skin:    General: Skin is warm and dry.     Capillary Refill: Capillary refill takes less than 2 seconds.  Neurological:     General: No focal deficit present.     Mental Status: She is alert and oriented to person, place, and time.  Psychiatric:        Mood and Affect: Mood normal.        Behavior: Behavior normal.        Thought Content: Thought content normal.        Judgment: Judgment normal.   Today's Vitals   12/09/20 1408  BP: 128/66  Pulse: 68  Temp: 98.1 F (36.7 C)  SpO2: 100%  Weight: 200 lb 3.2 oz (90.8 kg)  Height: _0  (1.778 m)   Body mass index is 28.73 kg/m.   Wt Readings from Last 3 Encounters:  12/09/20 200 lb 3.2 oz (90.8 kg)  11/18/20 217 lb 8 oz (98.7 kg)  10/26/20 207 lb  (93.9 kg)    Health Maintenance Due  Topic Date Due   COVID-19 Vaccine (1) Never done   Hepatitis C Screening  Never done   PAP SMEAR-Modifier  Never done   Zoster Vaccines- Shingrix (1 of 2) Never done   INFLUENZA VACCINE  Never done    There are no preventive care reminders to display for this patient.   Lab Results  Component Value Date   TSH 0.817 07/08/2020   Lab Results  Component Value Date   WBC 6.2 07/08/2020   HGB 12.6 07/08/2020   HCT 37.8 07/08/2020   MCV 88 07/08/2020   PLT 223 07/08/2020   Lab Results  Component Value Date   NA 142 07/08/2020   K 4.1 07/08/2020   CO2 24 07/08/2020   GLUCOSE 84 07/08/2020   BUN 11 07/08/2020   CREATININE 0.91 07/08/2020   BILITOT 0.8 07/08/2020   ALKPHOS 110 07/08/2020   AST 25 07/08/2020   ALT 19 07/08/2020   PROT 7.4 07/08/2020   ALBUMIN 4.5 07/08/2020   CALCIUM 9.2 07/08/2020   EGFR 73 07/08/2020   Lab Results  Component Value Date   CHOL 231 (H) 07/08/2020   Lab Results  Component Value Date   HDL 74 07/08/2020   Lab Results  Component Value Date   LDLCALC 143 (H) 07/08/2020   Lab Results  Component Value Date   TRIG 83 07/08/2020   Lab Results  Component Value Date   CHOLHDL 3.1 07/08/2020   No results found for: HGBA1C     Assessment & Plan:  1. Acute pain of right knee Suspect tendon or ligamental injury. Will get x-ray of right knee. Continue to rest, ice, and elevate the knee when possible. Continue to use compression bandage on the knee when working. Use OTC and natural supplements to control pain and inflammation. Refer to orthopedics for further evaluation and treatment.  - DG Knee 1-2 Views Right; Future - Ambulatory referral to Orthopedic Surgery  Problem List Items Addressed This Visit       Other   Acute pain of right knee - Primary   Relevant Orders   DG Knee 1-2 Views Right   Ambulatory referral to Orthopedic Surgery       Ronnell Freshwater, NP

## 2020-12-11 ENCOUNTER — Encounter: Payer: Self-pay | Admitting: Nurse Practitioner

## 2020-12-14 ENCOUNTER — Encounter: Payer: Self-pay | Admitting: Nurse Practitioner

## 2020-12-14 NOTE — Progress Notes (Signed)
Mild osteoarthritis. Can refer to orthop for persistent pain. Mychart message sent to patient.

## 2021-07-08 ENCOUNTER — Encounter: Payer: Self-pay | Admitting: Nurse Practitioner

## 2021-07-29 ENCOUNTER — Encounter: Payer: PRIVATE HEALTH INSURANCE | Admitting: Nurse Practitioner

## 2021-09-14 ENCOUNTER — Emergency Department (HOSPITAL_BASED_OUTPATIENT_CLINIC_OR_DEPARTMENT_OTHER): Payer: PRIVATE HEALTH INSURANCE

## 2021-09-14 ENCOUNTER — Emergency Department (HOSPITAL_BASED_OUTPATIENT_CLINIC_OR_DEPARTMENT_OTHER)
Admission: EM | Admit: 2021-09-14 | Discharge: 2021-09-14 | Disposition: A | Discharge status: Home / Self Care | Payer: No Typology Code available for payment source | Attending: Emergency Medicine | Admitting: Emergency Medicine

## 2021-09-14 ENCOUNTER — Other Ambulatory Visit: Payer: Self-pay

## 2021-09-14 ENCOUNTER — Encounter (HOSPITAL_BASED_OUTPATIENT_CLINIC_OR_DEPARTMENT_OTHER): Payer: Self-pay | Admitting: Emergency Medicine

## 2021-09-14 DIAGNOSIS — W230XXA Caught, crushed, jammed, or pinched between moving objects, initial encounter: Secondary | ICD-10-CM | POA: Insufficient documentation

## 2021-09-14 DIAGNOSIS — S61216A Laceration without foreign body of right little finger without damage to nail, initial encounter: Secondary | ICD-10-CM | POA: Insufficient documentation

## 2021-09-14 DIAGNOSIS — S62636B Displaced fracture of distal phalanx of right little finger, initial encounter for open fracture: Secondary | ICD-10-CM | POA: Insufficient documentation

## 2021-09-14 DIAGNOSIS — S6991XA Unspecified injury of right wrist, hand and finger(s), initial encounter: Secondary | ICD-10-CM | POA: Diagnosis present

## 2021-09-14 DIAGNOSIS — Y92813 Airplane as the place of occurrence of the external cause: Secondary | ICD-10-CM | POA: Insufficient documentation

## 2021-09-14 DIAGNOSIS — Z91040 Latex allergy status: Secondary | ICD-10-CM | POA: Insufficient documentation

## 2021-09-14 DIAGNOSIS — S61314A Laceration without foreign body of right ring finger with damage to nail, initial encounter: Secondary | ICD-10-CM

## 2021-09-14 DIAGNOSIS — S62634B Displaced fracture of distal phalanx of right ring finger, initial encounter for open fracture: Secondary | ICD-10-CM

## 2021-09-14 MED ORDER — OXYCODONE-ACETAMINOPHEN 5-325 MG PO TABS
1.0000 | ORAL_TABLET | Freq: Once | ORAL | Status: DC
  Filled 2021-09-14: qty 1

## 2021-09-14 MED ORDER — CEPHALEXIN 250 MG PO CAPS
500.0000 mg | ORAL_CAPSULE | Freq: Once | ORAL | Status: AC
  Administered 2021-09-14: 500 mg via ORAL
  Filled 2021-09-14: qty 2

## 2021-09-14 MED ORDER — CEPHALEXIN 500 MG PO CAPS: 500.0000 mg | ORAL_CAPSULE | Freq: Four times a day (QID) | ORAL | 0 refills | Status: DC

## 2021-09-14 MED ORDER — LIDOCAINE HCL 2 % IJ SOLN
20.0000 mL | Freq: Once | INTRAMUSCULAR | Status: AC
  Administered 2021-09-14: 400 mg
  Filled 2021-09-14: qty 20

## 2021-09-14 NOTE — ED Notes (Signed)
Irrigated right little finger injury with 1L of NS. Pt tolerated well.

## 2021-09-14 NOTE — Discharge Instructions (Addendum)
Keep bandage on until you see Dr Yehuda Budd tomorrow.   1. Medications: Tylenol or ibuprofen for pain, usual home medications 2. Treatment: ice for swelling, keep wound clean with warm soap and water and keep bandage dry, do not submerge in water for 24 hours days to have your stitches/staples removed or sooner if you have concerns. Return to the emergency department for increased redness, drainage of pus from the wound, or fevers/chills.   WOUND CARE  Keep area clean and dry for 24 hours. Do not remove bandage, if applied.  After 24 hours, remove bandage and wash wound gently with mild soap and warm water. Reapply a new bandage after cleaning wound, if directed.   Continue daily cleansing with soap and water until stitches/staples are removed.  Do not apply any ointments or creams to the wound while stitches/staples are in place, as this may cause delayed healing. Return if you experience any of the following signs of infection: Swelling, redness, pus drainage, streaking, fever >101.0 F  Return if you experience excessive bleeding that does not stop after 15-20 minutes of constant, firm pressure.

## 2021-09-14 NOTE — ED Triage Notes (Signed)
Laceration to right pinky finger, dressing and cold pack in place.

## 2021-09-14 NOTE — ED Provider Notes (Signed)
[facility] EMERGENCY DEPARTMENT Provider Note   CSN: [id number] Arrival date & time: 09/14/21  1931     History  Chief Complaint  Patient presents with   Laceration    Angela Bartlett is a 61 y.o. female who presents to the ED for right little finger crush injury and laceration that occurred 1 hour prior to arrival.  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  She is having significant pain and discomfort.  It was wrapped prior to arrival.  She has no other injuries or complaints.   Laceration Associated symptoms: no fever        Home Medications Prior to Admission medications   Medication Sig Start Date End Date Taking? Authorizing Provider  cephALEXin (KEFLEX) 500 MG capsule Take 1 capsule (500 mg total) by mouth 4 (four) times daily. 09/14/21  Yes Janell Quiet, PA-C  azithromycin (ZITHROMAX) 250 MG tablet z-pack - take as directed for 5 days 11/18/20   Carlean Jews, NP      Allergies    Iodine and Latex    Review of Systems   Review of Systems  Constitutional:  Negative for fever.  Cardiovascular:  Negative for chest pain.  Gastrointestinal:  Negative for abdominal pain.  Skin:  Positive for wound.    Physical Exam Updated Vital Signs BP (!) 149/87 (BP Location: Right Arm)   Pulse 80   Temp 98.4 F (36.9 C) (Oral)   Resp 20   Ht 5\' 10"  (1.778 m)   Wt 90.7 kg   LMP  (LMP Unknown)   SpO2 92%   BMI 28.70 kg/m  Physical Exam Vitals and nursing note reviewed.  Constitutional:      General: She is not in acute distress.    Appearance: She is not ill-appearing.     Comments: tearful  HENT:     Head: Atraumatic.  Eyes:     Conjunctiva/sclera: Conjunctivae normal.  Cardiovascular:     Rate and Rhythm: Normal rate and regular rhythm.     Pulses: Normal pulses.     Heart sounds: No murmur heard. Pulmonary:     Effort: Pulmonary effort is normal. No  respiratory distress.     Breath sounds: Normal breath sounds.  Abdominal:     General: Abdomen is flat. There is no distension.     Palpations: Abdomen is soft.     Tenderness: There is no abdominal tenderness.  Musculoskeletal:        General: Normal range of motion.     Cervical back: Normal range of motion.  Skin:    General: Skin is warm and dry.     Capillary Refill: Capillary refill takes less than 2 seconds.     Comments: Picture below. Part of finger pad beneath right little finger nail appears avulsed  Neurological:     General: No focal deficit present.     Mental Status: She is alert.  Psychiatric:        Mood and Affect: Mood normal.        ED Results / Procedures / Treatments   Labs (all labs ordered are listed, but only abnormal results are displayed) Labs Reviewed - No data to display  EKG None  Radiology DG Finger Little Right  Result Date: 09/14/2021 CLINICAL DATA:  Laceration of the distal right fifth digit. EXAM: RIGHT LITTLE FINGER 2+V COMPARISON:  None Available.  FINDINGS: Mildly displaced fracture of the tuft of the distal phalanx of the fifth digit. No other acute fracture. No dislocation. Mild degenerative changes of the interphalangeal joints. Laceration of the soft tissues of the tip of the digit. No radiopaque foreign object or soft tissue gas. IMPRESSION: Mildly displaced fracture of the tuft of the distal phalanx of the fifth digit. Electronically Signed   By: Elgie Collard M.D.   On: 09/14/2021 20:21    Procedures .Marland KitchenLaceration Repair  Date/Time: 09/14/2021 9:35 PM  Performed by: Janell Quiet, PA-C Authorized by: Janell Quiet, PA-C   Consent:    Consent obtained:  Verbal   Consent given by:  Patient   Risks discussed:  Infection, need for additional repair, pain, poor cosmetic result and poor wound healing   Alternatives discussed:  No treatment and delayed treatment Universal protocol:    Procedure explained and questions  answered to patient or proxy's satisfaction: yes     Relevant documents present and verified: yes     Test results available: yes     Imaging studies available: yes     Required blood products, implants, devices, and special equipment available: yes     Site/side marked: yes     Immediately prior to procedure, a time out was called: yes     Patient identity confirmed:  Verbally with patient Anesthesia:    Anesthesia method:  Nerve block   Block anesthetic:  Lidocaine 2% w/o epi   Block technique:  Digital   Block injection procedure:  Anatomic landmarks identified, introduced needle, incremental injection and negative aspiration for blood   Block outcome:  Anesthesia achieved Laceration details:    Location:  Finger   Finger location:  R small finger   Length (cm):  2   Laceration depth: partial fingerpad amputation. Exploration:    Hemostasis achieved with:  Direct pressure   Imaging obtained: x-ray     Wound exploration: entire depth of wound visualized     Contaminated: no   Treatment:    Area cleansed with:  Saline   Irrigation solution:  Sterile saline   Irrigation volume:    Irrigation method:  Pressure wash   Visualized foreign bodies/material removed: no     Debridement:  None   Undermining:  None   Scar revision: no   Skin repair:    Repair method:  Sutures   Suture size:  5-0   Suture material:  Prolene   Suture technique:  Simple interrupted   Number of sutures:  2 Approximation:    Approximation:  Loose Repair type:    Repair type:  Simple Post-procedure details:    Dressing:  Antibiotic ointment, splint for protection and bulky dressing   Procedure completion:  Tolerated well, no immediate complications     Medications Ordered in ED Medications  oxyCODONE-acetaminophen (PERCOCET/ROXICET) 5-325 MG per tablet 1 tablet (1 tablet Oral Not Given 09/14/21 2015)  lidocaine (XYLOCAINE) 2 % (with pres) injection 400 mg (400 mg Other Given 09/14/21 2015)   cephALEXin (KEFLEX) capsule 500 mg (500 mg Oral Given 09/14/21 2147)    ED Course/ Medical Decision Making/ A&P                           Medical Decision Making Amount and/or Complexity of Data Reviewed Radiology: ordered.  Risk Prescription drug management.   Social determinants of health:  Social History   Socioeconomic History   Marital status: Married  Spouse name: Not on file   Number of children: Not on file   Years of education: Not on file   Highest education level: Not on file  Occupational History   Not on file  Tobacco Use   Smoking status: Never   Smokeless tobacco: Never  Substance and Sexual Activity   Alcohol use: Not Currently   Drug use: Not Currently   Sexual activity: Not Currently  Other Topics Concern   Not on file  Social History Narrative   ** Merged History Encounter **       Social Determinants of Health   Financial Resource Strain: Not on file  Food Insecurity: Not on file  Transportation Needs: Not on file  Physical Activity: Not on file  Stress: Not on file  Social Connections: Not on file  Intimate Partner Violence: Not on file     Initial impression:  This patient presents to the ED for concern of crush finger injury, this involves an extensive number of treatment options, and is a complaint that carries with it a high risk of complications and morbidity.   Comorbidities affecting care:  none  Additional history obtained: none  Imaging Studies ordered:  I ordered imaging studies including  Right little finger x-ray shows fracture of the tuft of the distal phalanx of the fifth digit  I independently visualized and interpreted imaging and I agree with the radiologist interpretation.    Medicines ordered and prescription drug management:  I ordered medication including: Percocet Lidocaine 2% wo epi   Reevaluation of the patient after these medicines showed that the patient improved I have reviewed the patients home  medicines and have made adjustments as needed   Consultations Obtained:  I requested consultation with hand surgery and spoke with Dr Yehuda Budd,  and discussed lab and imaging findings as well as pertinent plan - they recommend: he will see patient tomorrow in his office at 3pm. He agrees with plan for extensive irrigation and keflex. Advises that if there are some areas that really need repair I can suture here, otherwise he will evaluate and take care of patient's would tomorrow.    ED Course/Re-evaluation: Presents distressed and tearful but nontoxic appearing. 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 Xray shows mild fracture. Pain was managed with digital block and Percocet. Wound was irrigated with 1L sterile saline. 2 sutures were placed in the horizontal laceration underneath the partial amputation.  Wound was otherwise then wrapped with Xeroform gauze with a bulky dressing and a splint for protection.  Advised to keep dressing on until she can see the hand surgeon tomorrow.  Tylenol Motrin for pain.  Her first dose of Keflex was given here in emergency department.   Disposition:  After consideration of the diagnostic results, physical exam, history and the patients response to treatment feel that the patent would benefit from discharge.   Laceration of the right ring finger without foreign body Open displaced fracture of distal phalanx of the ring finger: Plan and management as described above. Discharged home in good condition.  Final Clinical Impression(s) / ED Diagnoses Final diagnoses:  Laceration of right ring finger without foreign body with damage to nail, initial encounter  Open displaced fracture of distal phalanx of right ring finger, initial encounter    Rx / DC Orders ED Discharge Orders          Ordered    cephALEXin (KEFLEX) 500 MG capsule  4 times daily  09/14/21 2133              Janell Quiet,  PA-C 09/14/21 2155    Virgina Norfolk, DO 09/14/21 2156

## 2021-09-15 ENCOUNTER — Other Ambulatory Visit (HOSPITAL_BASED_OUTPATIENT_CLINIC_OR_DEPARTMENT_OTHER): Payer: Self-pay

## 2021-09-15 DIAGNOSIS — S62636B Displaced fracture of distal phalanx of right little finger, initial encounter for open fracture: Secondary | ICD-10-CM | POA: Diagnosis not present

## 2021-09-22 DIAGNOSIS — M79644 Pain in right finger(s): Secondary | ICD-10-CM | POA: Diagnosis not present

## 2021-09-30 DIAGNOSIS — M25552 Pain in left hip: Secondary | ICD-10-CM | POA: Diagnosis not present

## 2021-10-03 DIAGNOSIS — M25641 Stiffness of right hand, not elsewhere classified: Secondary | ICD-10-CM | POA: Diagnosis not present

## 2021-10-13 DIAGNOSIS — M25641 Stiffness of right hand, not elsewhere classified: Secondary | ICD-10-CM | POA: Diagnosis not present

## 2021-10-20 DIAGNOSIS — M25641 Stiffness of right hand, not elsewhere classified: Secondary | ICD-10-CM | POA: Diagnosis not present

## 2021-10-27 DIAGNOSIS — M25641 Stiffness of right hand, not elsewhere classified: Secondary | ICD-10-CM | POA: Diagnosis not present

## 2021-11-04 DIAGNOSIS — M25641 Stiffness of right hand, not elsewhere classified: Secondary | ICD-10-CM | POA: Diagnosis not present

## 2022-03-09 NOTE — Progress Notes (Signed)
Complete physical exam   Patient: Angela Bartlett   DOB: April 09, 1960   62 y.o. Female  MRN: [id number] Visit Date: 03/10/2022    Chief Complaint  Patient presents with   Annual Exam   Subjective    Angela Bartlett is a 62 y.o. female who presents today for a complete physical exam.  She reports consuming a  generally healthy  diet.  She generally feels well. She does not have additional problems to discuss today.   HPI  Annual physical  -due for routine, fasting labs  -she is due for well woman exam  -she declines mammogram and colonoscopy  -she declines routine immunizations   -She denies chest pain, chest pressure, or shortness of breath. She denies headaches or visual disturbances. She denies abdominal pain, nausea, vomiting, or changes in bowel or bladder habits.    No past medical history on file.  No past surgical history on file.  Social History   Socioeconomic History   Marital status: Divorced    Spouse name: Not on file   Number of children: Not on file   Years of education: Not on file   Highest education level: Not on file  Occupational History   Not on file  Tobacco Use   Smoking status: Never    Passive exposure: Never   Smokeless tobacco: Never  Substance and Sexual Activity   Alcohol use: Not Currently   Drug use: Not Currently   Sexual activity: Not Currently  Other Topics Concern   Not on file  Social History Narrative   ** Merged History Encounter **       Social Determinants of Health   Financial Resource Strain: Not on file  Food Insecurity: Not on file  Transportation Needs: Not on file  Physical Activity: Not on file  Stress: Not on file  Social Connections: Not on file  Intimate Partner Violence: Not on file   No family status information on file.   No family history on file. Allergies  Allergen Reactions   Iodine    Latex     Patient Care Team: Ronnell Freshwater, NP as PCP - General (Family Medicine) Ronnell Freshwater, NP  (Family Medicine)   Medications: Outpatient Medications Prior to Visit  Medication Sig   azithromycin (ZITHROMAX) 250 MG tablet z-pack - take as directed for 5 days (Patient not taking: Reported on 03/10/2022)   cephALEXin (KEFLEX) 500 MG capsule Take 1 capsule (500 mg total) by mouth 4 (four) times daily. (Patient not taking: Reported on 03/10/2022)   No facility-administered medications prior to visit.    Review of Systems  Constitutional:  Negative for activity change, appetite change, chills, fatigue and fever.  HENT:  Negative for congestion, postnasal drip, rhinorrhea, sinus pressure, sinus pain, sneezing and sore throat.   Eyes: Negative.   Respiratory:  Negative for cough, chest tightness, shortness of breath and wheezing.   Cardiovascular:  Negative for chest pain and palpitations.  Gastrointestinal:  Negative for abdominal pain, constipation, diarrhea, nausea and vomiting.  Endocrine: Negative for cold intolerance, heat intolerance, polydipsia and polyuria.  Genitourinary:  Negative for dyspareunia, dysuria, flank pain, frequency and urgency.  Musculoskeletal:  Negative for arthralgias, back pain and myalgias.  Skin:  Negative for rash.  Allergic/Immunologic: Negative for environmental allergies.  Neurological:  Negative for dizziness, weakness and headaches.  Hematological:  Negative for adenopathy.  Psychiatric/Behavioral:  The patient is not nervous/anxious.     Last CBC Lab Results  Component Value Date  WBC 6.2 07/08/2020   HGB 12.6 07/08/2020   HCT 37.8 07/08/2020   MCV 88 07/08/2020   MCH 29.4 07/08/2020   RDW 14.2 07/08/2020   PLT 223 07/08/2020   Last metabolic panel Lab Results  Component Value Date   GLUCOSE 84 07/08/2020   NA 142 07/08/2020   K 4.1 07/08/2020   CL 104 07/08/2020   CO2 24 07/08/2020   BUN 11 07/08/2020   CREATININE 0.91 07/08/2020   EGFR 73 07/08/2020   CALCIUM 9.2 07/08/2020   PROT 7.4 07/08/2020   ALBUMIN 4.5 07/08/2020    LABGLOB 2.9 07/08/2020   AGRATIO 1.6 07/08/2020   BILITOT 0.8 07/08/2020   ALKPHOS 110 07/08/2020   AST 25 07/08/2020   ALT 19 07/08/2020   Last lipids Lab Results  Component Value Date   CHOL 231 (H) 07/08/2020   HDL 74 07/08/2020   LDLCALC 143 (H) 07/08/2020   TRIG 83 07/08/2020   CHOLHDL 3.1 07/08/2020   Last hemoglobin A1c No results found for: "HGBA1C" Last thyroid functions Lab Results  Component Value Date   TSH 0.817 07/08/2020       Objective     Today's Vitals   03/10/22 0913  BP: 134/84  Pulse: 66  Resp: 18  SpO2: 95%  Weight: 199 lb (90.3 kg)  Height: 5\' 10"  (1.778 m)   Body mass index is 28.55 kg/m.  BP Readings from Last 3 Encounters:  03/10/22 134/84  09/14/21 (Abnormal) 149/87  12/09/20 128/66    Wt Readings from Last 3 Encounters:  03/10/22 199 lb (90.3 kg)  09/14/21 200 lb (90.7 kg)  12/09/20 200 lb 3.2 oz (90.8 kg)     Physical Exam Vitals and nursing note reviewed.  Constitutional:      Appearance: Normal appearance. She is well-developed.  HENT:     Head: Normocephalic and atraumatic.     Right Ear: Tympanic membrane, ear canal and external ear normal.     Left Ear: Tympanic membrane, ear canal and external ear normal.     Nose: Nose normal.     Mouth/Throat:     Mouth: Mucous membranes are moist.     Pharynx: Oropharynx is clear.  Eyes:     Extraocular Movements: Extraocular movements intact.     Conjunctiva/sclera: Conjunctivae normal.     Pupils: Pupils are equal, round, and reactive to light.  Cardiovascular:     Rate and Rhythm: Normal rate and regular rhythm.     Pulses: Normal pulses.     Heart sounds: Normal heart sounds.  Pulmonary:     Effort: Pulmonary effort is normal.     Breath sounds: Normal breath sounds.  Abdominal:     General: Bowel sounds are normal. There is no distension.     Palpations: Abdomen is soft. There is no mass.     Tenderness: There is no abdominal tenderness. There is no right CVA  tenderness, left CVA tenderness, guarding or rebound.     Hernia: No hernia is present.  Musculoskeletal:        General: Normal range of motion.     Cervical back: Normal range of motion and neck supple.  Lymphadenopathy:     Cervical: No cervical adenopathy.  Skin:    General: Skin is warm and dry.     Capillary Refill: Capillary refill takes less than 2 seconds.  Neurological:     General: No focal deficit present.     Mental Status: She is alert and oriented  to person, place, and time.  Psychiatric:        Mood and Affect: Mood normal.        Behavior: Behavior normal.        Thought Content: Thought content normal.        Judgment: Judgment normal.      Last depression screening scores   Row Labels 03/10/2022    9:15 AM 12/09/2020    2:09 PM 11/18/2020    2:06 PM  PHQ 2/9 Scores   Section Header. No data exists in this row.     PHQ - 2 Score   0 0 0  PHQ- 9 Score   0 0 0   Last fall risk screening   Row Labels 03/10/2022    9:15 AM  Fall Risk    Section Header. No data exists in this row.   Falls in the past year?   0  Number falls in past yr:   0  Injury with Fall?   0    Assessment & Plan    1. Encounter for general adult medical examination with abnormal findings Annual physical today   2. Other fatigue Check thyroid panel  - TSH + free T4  3. Vitamin D deficiency Check vitamin d level and treat deficiency as indicated.   - VITAMIN D 25 Hydroxy (Vit-D Deficiency, Fractures)  4. BMI 28.0-28.9,adult Discussed lowering calorie intake to 1500 calories per day and incorporating exercise into daily routine to help lose weight.   5. Healthcare maintenance Routine, fasting labs drawn during today's visit.  - Hemoglobin A1c - Lipid panel - Comprehensive metabolic panel - CBC   Immunization History  Administered Date(s) Administered   Td 01/12/2004    Health Maintenance  Topic Date Due   COVID-19 Vaccine (1) Never done   HIV Screening  Never done    Hepatitis C Screening  Never done   PAP SMEAR-Modifier  Never done   Zoster Vaccines- Shingrix (1 of 2) Never done   DTaP/Tdap/Td (2 - Tdap) 01/11/2014   INFLUENZA VACCINE  05/28/2022 (Originally 09/27/2021)   MAMMOGRAM  03/11/2023 (Originally 10/11/2011)   COLONOSCOPY (Pts 45-77yrs Insurance coverage will need to be confirmed)  03/11/2023 (Originally 10/11/2006)   HPV VACCINES  Aged Out    Discussed health benefits of physical activity, and encouraged her to engage in regular exercise appropriate for her age and condition.  Problem List Items Addressed This Visit       Other   Encounter for general adult medical examination with abnormal findings - Primary   Healthcare maintenance   Relevant Orders   Hemoglobin A1c   Lipid panel   Comprehensive metabolic panel   CBC   Vitamin D deficiency   Relevant Orders   VITAMIN D 25 Hydroxy (Vit-D Deficiency, Fractures)   Other fatigue   Relevant Orders   TSH + free T4   BMI 28.0-28.9,adult     Return in about 4 months (around 07/09/2022) for well woman exam, with pap.        Ronnell Freshwater, NP  [facility] [phone] (phone) [phone] (fax)  [place]

## 2022-03-10 ENCOUNTER — Encounter: Payer: Self-pay | Admitting: Nurse Practitioner

## 2022-03-10 ENCOUNTER — Ambulatory Visit (INDEPENDENT_AMBULATORY_CARE_PROVIDER_SITE_OTHER): Payer: Self-pay | Admitting: Nurse Practitioner

## 2022-03-10 VITALS — BP 134/84 | HR 66 | Resp 18 | Ht 70.0 in | Wt 199.0 lb

## 2022-03-10 DIAGNOSIS — Z0001 Encounter for general adult medical examination with abnormal findings: Secondary | ICD-10-CM

## 2022-03-10 DIAGNOSIS — E559 Vitamin D deficiency, unspecified: Secondary | ICD-10-CM

## 2022-03-10 DIAGNOSIS — R5383 Other fatigue: Secondary | ICD-10-CM

## 2022-03-10 DIAGNOSIS — Z6828 Body mass index (BMI) 28.0-28.9, adult: Secondary | ICD-10-CM

## 2022-03-10 DIAGNOSIS — Z0000 Encounter for general adult medical examination without abnormal findings: Secondary | ICD-10-CM | POA: Diagnosis not present

## 2022-03-11 LAB — CBC
Hematocrit: 38.9 % (ref 34.0–46.6)
Hemoglobin: 12.8 g/dL (ref 11.1–15.9)
MCH: 29.8 pg (ref 26.6–33.0)
MCHC: 32.9 g/dL (ref 31.5–35.7)
MCV: 91 fL (ref 79–97)
Platelets: 252 10*3/uL (ref 150–450)
RBC: 4.3 x10E6/uL (ref 3.77–5.28)
RDW: 13.9 % (ref 11.7–15.4)
WBC: 4.6 10*3/uL (ref 3.4–10.8)

## 2022-03-11 LAB — COMPREHENSIVE METABOLIC PANEL
ALT: 13 IU/L (ref 0–32)
AST: 19 IU/L (ref 0–40)
Albumin/Globulin Ratio: 1.5 (ref 1.2–2.2)
Albumin: 4.3 g/dL (ref 3.8–4.9)
Alkaline Phosphatase: 106 IU/L (ref 44–121)
BUN/Creatinine Ratio: 11 — ABNORMAL LOW (ref 12–28)
BUN: 10 mg/dL (ref 8–27)
Bilirubin Total: 0.8 mg/dL (ref 0.0–1.2)
CO2: 23 mmol/L (ref 20–29)
Calcium: 9.5 mg/dL (ref 8.7–10.3)
Chloride: 105 mmol/L (ref 96–106)
Creatinine, Ser: 0.92 mg/dL (ref 0.57–1.00)
Globulin, Total: 2.9 g/dL (ref 1.5–4.5)
Glucose: 93 mg/dL (ref 70–99)
Potassium: 4.3 mmol/L (ref 3.5–5.2)
Sodium: 144 mmol/L (ref 134–144)
Total Protein: 7.2 g/dL (ref 6.0–8.5)
eGFR: 71 mL/min/{1.73_m2} (ref >59)

## 2022-03-11 LAB — LIPID PANEL
Chol/HDL Ratio: 3.7 ratio (ref 0.0–4.4)
Cholesterol, Total: 261 mg/dL — ABNORMAL HIGH (ref 100–199)
HDL: 71 mg/dL (ref >39)
LDL Chol Calc (NIH): 173 mg/dL — ABNORMAL HIGH (ref 0–99)
Triglycerides: 100 mg/dL (ref 0–149)
VLDL Cholesterol Cal: 17 mg/dL (ref 5–40)

## 2022-03-11 LAB — HEMOGLOBIN A1C
Est. average glucose Bld gHb Est-mCnc: 126 mg/dL
Hgb A1c MFr Bld: 6 % — ABNORMAL HIGH (ref 4.8–5.6)

## 2022-03-11 LAB — TSH+FREE T4
Free T4: 1.41 ng/dL (ref 0.82–1.77)
TSH: 0.746 u[IU]/mL (ref 0.450–4.500)

## 2022-03-11 LAB — VITAMIN D 25 HYDROXY (VIT D DEFICIENCY, FRACTURES): Vit D, 25-Hydroxy: 23.2 ng/mL — ABNORMAL LOW (ref 30.0–100.0)

## 2022-04-12 DIAGNOSIS — T781XXA Other adverse food reactions, not elsewhere classified, initial encounter: Secondary | ICD-10-CM | POA: Diagnosis not present

## 2022-04-12 DIAGNOSIS — L03116 Cellulitis of left lower limb: Secondary | ICD-10-CM | POA: Diagnosis not present

## 2022-07-10 ENCOUNTER — Ambulatory Visit: Payer: Self-pay | Admitting: Nurse Practitioner

## 2022-07-27 ENCOUNTER — Ambulatory Visit (INDEPENDENT_AMBULATORY_CARE_PROVIDER_SITE_OTHER): Payer: BC Managed Care – PPO | Admitting: Nurse Practitioner

## 2022-07-27 ENCOUNTER — Other Ambulatory Visit (HOSPITAL_COMMUNITY)
Admission: RE | Admit: 2022-07-27 | Discharge: 2022-07-27 | Disposition: A | Discharge status: Home / Self Care | Payer: BC Managed Care – PPO | Source: Ambulatory Visit | Attending: Nurse Practitioner | Admitting: Nurse Practitioner

## 2022-07-27 ENCOUNTER — Encounter: Payer: Self-pay | Admitting: Nurse Practitioner

## 2022-07-27 VITALS — BP 155/80 | HR 64 | Ht 70.0 in | Wt 201.0 lb

## 2022-07-27 DIAGNOSIS — E559 Vitamin D deficiency, unspecified: Secondary | ICD-10-CM

## 2022-07-27 DIAGNOSIS — Z01419 Encounter for gynecological examination (general) (routine) without abnormal findings: Secondary | ICD-10-CM | POA: Diagnosis not present

## 2022-07-27 DIAGNOSIS — Z6828 Body mass index (BMI) 28.0-28.9, adult: Secondary | ICD-10-CM | POA: Diagnosis not present

## 2022-07-27 NOTE — Progress Notes (Signed)
Complete physical exam   Patient: Angela Bartlett   DOB: 14-Mar-1960   62 y.o. Female  MRN: [id number] Visit Date: 07/27/2022    Chief Complaint  Patient presents with   Annual Exam   Gynecologic Exam   Subjective    Angela Bartlett is a 62 y.o. female who presents today for a complete physical exam.  She reports consuming a low sodium and heart healthy  diet.  She generally feels well. She does not have additional problems to discuss today.   HPI   Annual physical with pap smear.  -no current concerns or complaints.  -She denies chest pain, chest pressure, or shortness of breath. She denies headaches or visual disturbances. She denies abdominal pain, nausea, vomiting, or changes in bowel or bladder habits.     History reviewed. No pertinent past medical history.  History reviewed. No pertinent surgical history.  Social History   Socioeconomic History   Marital status: Divorced    Spouse name: Not on file   Number of children: Not on file   Years of education: Not on file   Highest education level: Not on file  Occupational History   Not on file  Tobacco Use   Smoking status: Never    Passive exposure: Never   Smokeless tobacco: Never  Substance and Sexual Activity   Alcohol use: Not Currently   Drug use: Not Currently   Sexual activity: Not Currently  Other Topics Concern   Not on file  Social History Narrative   ** Merged History Encounter **       Social Determinants of Health   Financial Resource Strain: Not on file  Food Insecurity: Not on file  Transportation Needs: Not on file  Physical Activity: Not on file  Stress: Not on file  Social Connections: Not on file  Intimate Partner Violence: Not on file   No family status information on file.   History reviewed. No pertinent family history. Allergies  Allergen Reactions   Iodine    Latex     Patient Care Team: Carlean Jews, NP as PCP - General (Family Medicine) Carlean Jews, NP (Family  Medicine)   Medications: No outpatient medications prior to visit.   No facility-administered medications prior to visit.    Review of Systems See HPI    Last CBC Lab Results  Component Value Date   WBC 4.6 03/10/2022   HGB 12.8 03/10/2022   HCT 38.9 03/10/2022   MCV 91 03/10/2022   MCH 29.8 03/10/2022   RDW 13.9 03/10/2022   PLT 252 03/10/2022   Last metabolic panel Lab Results  Component Value Date   GLUCOSE 93 03/10/2022   NA 144 03/10/2022   K 4.3 03/10/2022   CL 105 03/10/2022   CO2 23 03/10/2022   BUN 10 03/10/2022   CREATININE 0.92 03/10/2022   EGFR 71 03/10/2022   CALCIUM 9.5 03/10/2022   PROT 7.2 03/10/2022   ALBUMIN 4.3 03/10/2022   LABGLOB 2.9 03/10/2022   AGRATIO 1.5 03/10/2022   BILITOT 0.8 03/10/2022   ALKPHOS 106 03/10/2022   AST 19 03/10/2022   ALT 13 03/10/2022   Last lipids Lab Results  Component Value Date   CHOL 261 (H) 03/10/2022   HDL 71 03/10/2022   LDLCALC 173 (H) 03/10/2022   TRIG 100 03/10/2022   CHOLHDL 3.7 03/10/2022   Last hemoglobin A1c Lab Results  Component Value Date   HGBA1C 6.0 (H) 03/10/2022   Last thyroid functions Lab Results  Component  Value Date   TSH 0.746 03/10/2022   Last vitamin D Lab Results  Component Value Date   VD25OH 23.2 (L) 03/10/2022      Objective     Today's Vitals   07/27/22 1457 07/27/22 1536  BP: (Abnormal) 145/71 (Abnormal) 155/80  Pulse: 64   SpO2: 99%   Weight: 201 lb (91.2 kg)   Height: 5\' 10"  (1.778 m)    Body mass index is 28.84 kg/m.  BP Readings from Last 3 Encounters:  07/27/22 (Abnormal) 155/80  03/10/22 134/84  09/14/21 (Abnormal) 149/87    Wt Readings from Last 3 Encounters:  07/27/22 201 lb (91.2 kg)  03/10/22 199 lb (90.3 kg)  09/14/21 200 lb (90.7 kg)     Physical Exam Vitals and nursing note reviewed. Exam conducted with a chaperone present.  Constitutional:      Appearance: Normal appearance. She is well-developed.  HENT:     Head: Normocephalic  and atraumatic.     Nose: Nose normal.     Mouth/Throat:     Mouth: Mucous membranes are moist.     Pharynx: Oropharynx is clear.  Eyes:     Extraocular Movements: Extraocular movements intact.     Conjunctiva/sclera: Conjunctivae normal.     Pupils: Pupils are equal, round, and reactive to light.  Neck:     Vascular: No carotid bruit.  Cardiovascular:     Rate and Rhythm: Normal rate and regular rhythm.     Pulses: Normal pulses.     Heart sounds: Normal heart sounds.  Pulmonary:     Effort: Pulmonary effort is normal.     Breath sounds: Normal breath sounds.  Chest:  Breasts:    Right: Normal. No swelling, bleeding, inverted nipple, mass, nipple discharge, skin change or tenderness.     Left: Normal. No swelling, bleeding, inverted nipple, mass, nipple discharge, skin change or tenderness.  Abdominal:     Palpations: Abdomen is soft.     Hernia: There is no hernia in the left inguinal area or right inguinal area.  Genitourinary:    General: Normal vulva.     Exam position: Supine.     Labia:        Right: No rash, tenderness, lesion or injury.        Left: No rash, tenderness, lesion or injury.      Vagina: Normal. No signs of injury and foreign body. No vaginal discharge, erythema, tenderness, bleeding, lesions or prolapsed vaginal walls.     Cervix: No cervical motion tenderness, discharge, friability, lesion, erythema, cervical bleeding or eversion.     Uterus: Normal. Not deviated, not enlarged, not fixed, not tender and no uterine prolapse.      Adnexa: Right adnexa normal.     Comments: No tenderness, masses, or organomeglay present during bimanual exam .   Musculoskeletal:        General: Normal range of motion.     Cervical back: Normal range of motion and neck supple.  Lymphadenopathy:     Cervical: No cervical adenopathy.     Upper Body:     Right upper body: No axillary adenopathy.     Left upper body: No axillary adenopathy.     Lower Body: No right inguinal  adenopathy. No left inguinal adenopathy.  Skin:    General: Skin is warm and dry.     Capillary Refill: Capillary refill takes less than 2 seconds.  Neurological:     General: No focal deficit present.  Mental Status: She is alert and oriented to person, place, and time.  Psychiatric:        Mood and Affect: Mood normal.        Behavior: Behavior normal.        Thought Content: Thought content normal.        Judgment: Judgment normal.     Last depression screening scores   Row Labels 07/27/2022    2:59 PM 03/10/2022    9:15 AM 12/09/2020    2:09 PM  PHQ 2/9 Scores   Section Header. No data exists in this row.     PHQ - 2 Score   1 0 0  PHQ- 9 Score   1 0 0   Last fall risk screening   Row Labels 07/27/2022    2:58 PM  Fall Risk    Section Header. No data exists in this row.   Falls in the past year?   0  Number falls in past yr:   0  Injury with Fall?   0  Risk for fall due to :   No Fall Risks  Follow up   Falls evaluation completed    Results for orders placed or performed in visit on 07/27/22  Cytology - PAP( [place])  Result Value Ref Range   High risk HPV Negative    Adequacy      Satisfactory for evaluation. The presence or absence of an   Adequacy      endocervical/transformation zone component cannot be determined because   Adequacy of atrophy.    Diagnosis      - Negative for intraepithelial lesion or malignancy (NILM)   Comment Inflammation and atrophic changes are present.    Comment Normal Reference Range HPV - Negative     Assessment & Plan    Well woman exam -     Cytology - PAP; Future  Vitamin D deficiency Assessment & Plan: Recommend use of OTC vitamin d, 2000 to 5000 international unit daily).  -recheck vitamin d in 6 months   .    BMI 28.0-28.9,adult Assessment & Plan: Continue with low calorie, high-protein diet. Incorporate exercise into daily activities.         Immunization History  Administered Date(s) Administered    Td 01/12/2004    Health Maintenance  Topic Date Due   COVID-19 Vaccine (1) Never done   HIV Screening  Never done   Hepatitis C Screening  Never done   Zoster Vaccines- Shingrix (1 of 2) Never done   DTaP/Tdap/Td (2 - Tdap) 01/11/2014   MAMMOGRAM  03/11/2023 (Originally 10/11/2011)   Colonoscopy  03/11/2023 (Originally 10/11/2006)   INFLUENZA VACCINE  09/28/2022   PAP SMEAR-Modifier  07/26/2025   HPV VACCINES  Aged Out    Discussed health benefits of physical activity, and encouraged her to engage in regular exercise appropriate for her age and condition.   Return in about 1 year (around 07/27/2023) for health maintenance exam, FBW a week prior to visit.        Carlean Jews, NP  [facility] [phone] (phone) [phone] (fax)  [facility]

## 2022-08-01 LAB — CYTOLOGY - PAP
Comment: NEGATIVE
Diagnosis: NEGATIVE
High risk HPV: NEGATIVE

## 2022-08-01 NOTE — Progress Notes (Signed)
Normal pap. Repeat 3 years

## 2022-08-20 NOTE — Assessment & Plan Note (Signed)
Recommend use of OTC vitamin d, 2000 to 5000 international unit daily).  -recheck vitamin d in 6 months   .

## 2022-08-20 NOTE — Assessment & Plan Note (Signed)
Continue with low calorie, high-protein diet. Incorporate exercise into daily activities.   

## 2023-07-27 ENCOUNTER — Telehealth: Payer: Self-pay

## 2023-07-27 NOTE — Telephone Encounter (Signed)
 LVM to schedule physcial and labs

## 2023-07-30 ENCOUNTER — Other Ambulatory Visit: Payer: Self-pay

## 2023-08-06 ENCOUNTER — Other Ambulatory Visit

## 2023-08-07 ENCOUNTER — Encounter: Payer: Self-pay | Admitting: Family Medicine

## 2023-09-05 IMAGING — CR DG KNEE 1-2V*R*
2 series · 2 of 2 positions shown · non-contrast
Comparison: None.

CLINICAL DATA: Right knee pain with tightness.

EXAM:
RIGHT KNEE - 1-2 VIEW

[w knee ap right]
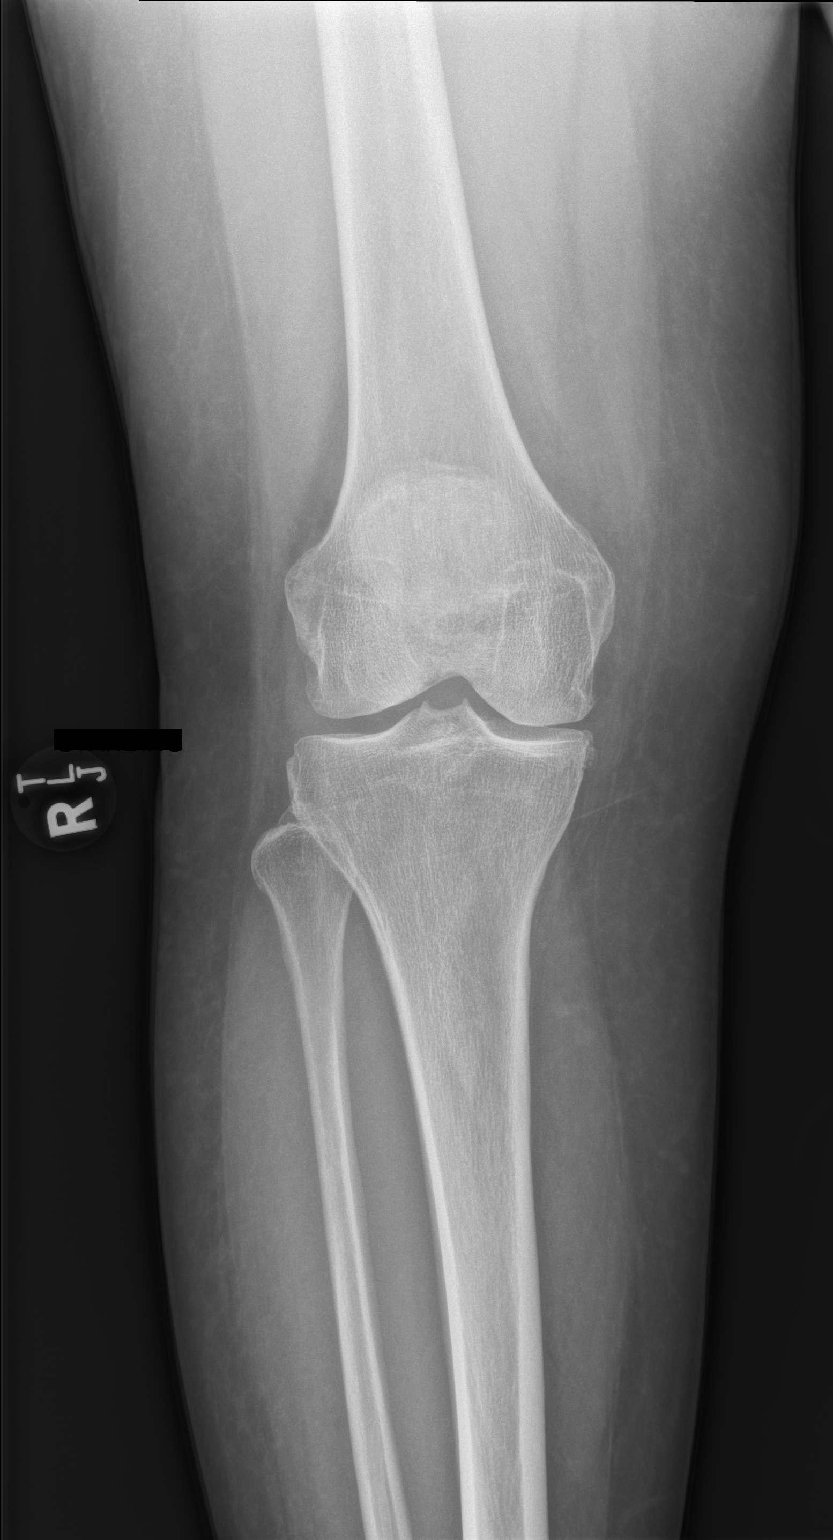

[w knee lat right]
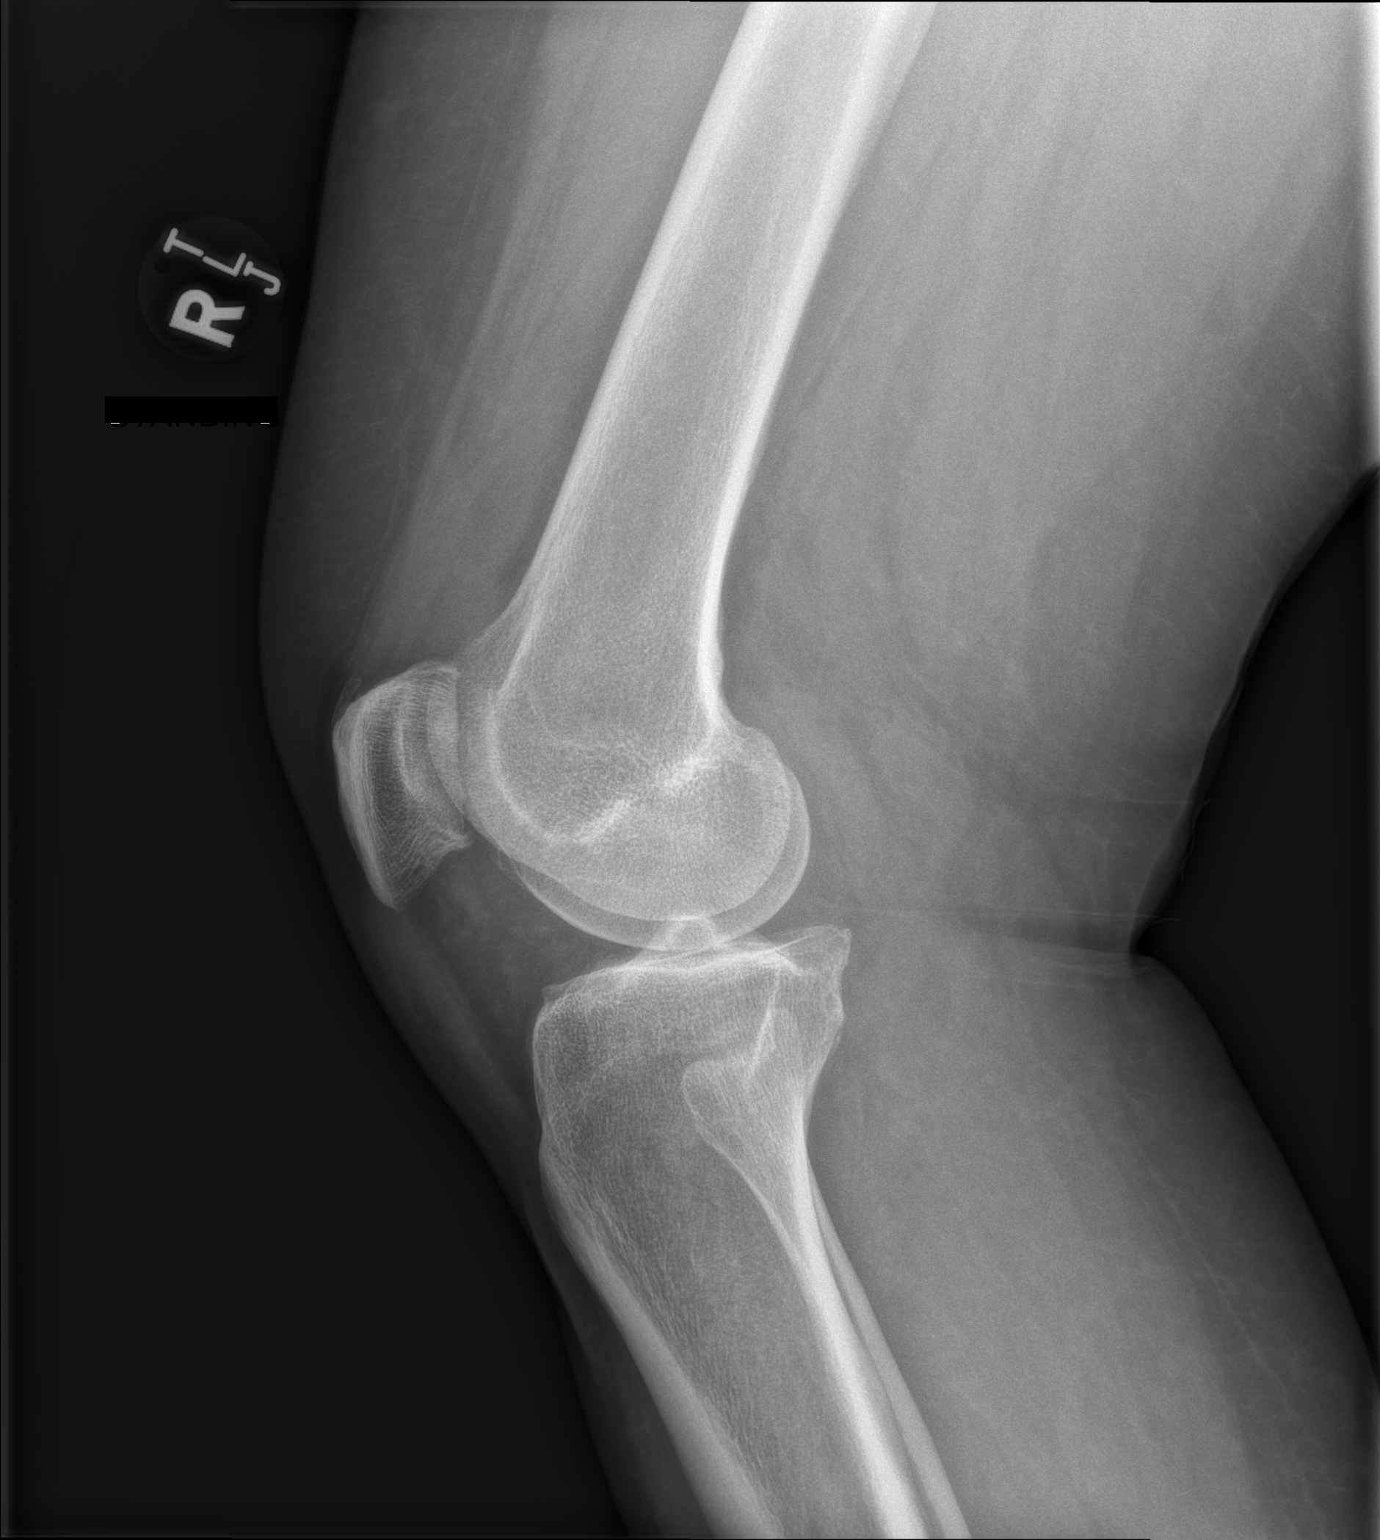

[2 of 2 positions shown; findings below may reference images not displayed]

FINDINGS: Negative for fracture or dislocation. Probable small suprapatellar
joint effusion. Mild joint space narrowing and osteophytosis
involving the medial knee compartment. Osteophytes involving the
patellofemoral compartment of the knee.
IMPRESSION: Mild osteoarthritis in the right knee without acute bone
abnormality.

## 2023-09-07 ENCOUNTER — Other Ambulatory Visit: Payer: Self-pay | Admitting: *Deleted

## 2023-09-07 DIAGNOSIS — Z6828 Body mass index (BMI) 28.0-28.9, adult: Secondary | ICD-10-CM

## 2023-09-07 DIAGNOSIS — Z130 Encounter for screening for diseases of the blood and blood-forming organs and certain disorders involving the immune mechanism: Secondary | ICD-10-CM

## 2023-09-10 ENCOUNTER — Other Ambulatory Visit

## 2023-09-10 DIAGNOSIS — Z130 Encounter for screening for diseases of the blood and blood-forming organs and certain disorders involving the immune mechanism: Secondary | ICD-10-CM

## 2023-09-10 DIAGNOSIS — Z6828 Body mass index (BMI) 28.0-28.9, adult: Secondary | ICD-10-CM

## 2023-09-11 ENCOUNTER — Ambulatory Visit: Payer: Self-pay

## 2023-09-11 LAB — COMPREHENSIVE METABOLIC PANEL WITH GFR
ALT: 10 IU/L (ref 0–32)
AST: 16 IU/L (ref 0–40)
Albumin: 4.1 g/dL (ref 3.9–4.9)
Alkaline Phosphatase: 112 IU/L (ref 44–121)
BUN/Creatinine Ratio: 12 (ref 12–28)
BUN: 11 mg/dL (ref 8–27)
Bilirubin Total: 1.1 mg/dL (ref 0.0–1.2)
CO2: 19 mmol/L — ABNORMAL LOW (ref 20–29)
Calcium: 9.3 mg/dL (ref 8.7–10.3)
Chloride: 106 mmol/L (ref 96–106)
Creatinine, Ser: 0.92 mg/dL (ref 0.57–1.00)
Globulin, Total: 3.2 g/dL (ref 1.5–4.5)
Glucose: 86 mg/dL (ref 70–99)
Potassium: 3.8 mmol/L (ref 3.5–5.2)
Sodium: 141 mmol/L (ref 134–144)
Total Protein: 7.3 g/dL (ref 6.0–8.5)
eGFR: 71 mL/min/1.73 (ref >59)

## 2023-09-11 LAB — CBC WITH DIFFERENTIAL/PLATELET
Basophils Absolute: 0 x10E3/uL (ref 0.0–0.2)
Basos: 1 %
EOS (ABSOLUTE): 0.1 x10E3/uL (ref 0.0–0.4)
Eos: 2 %
Hematocrit: 41.6 % (ref 34.0–46.6)
Hemoglobin: 13.1 g/dL (ref 11.1–15.9)
Immature Grans (Abs): 0 x10E3/uL (ref 0.0–0.1)
Immature Granulocytes: 0 %
Lymphocytes Absolute: 1.6 x10E3/uL (ref 0.7–3.1)
Lymphs: 36 %
MCH: 29.6 pg (ref 26.6–33.0)
MCHC: 31.5 g/dL (ref 31.5–35.7)
MCV: 94 fL (ref 79–97)
Monocytes Absolute: 0.3 x10E3/uL (ref 0.1–0.9)
Monocytes: 8 %
Neutrophils Absolute: 2.4 x10E3/uL (ref 1.4–7.0)
Neutrophils: 53 %
Platelets: 235 x10E3/uL (ref 150–450)
RBC: 4.43 x10E6/uL (ref 3.77–5.28)
RDW: 14.4 % (ref 11.7–15.4)
WBC: 4.5 x10E3/uL (ref 3.4–10.8)

## 2023-09-11 LAB — HEMOGLOBIN A1C
Est. average glucose Bld gHb Est-mCnc: 126 mg/dL
Hgb A1c MFr Bld: 6 % — ABNORMAL HIGH (ref 4.8–5.6)

## 2023-09-11 LAB — LIPID PANEL
Chol/HDL Ratio: 3.7 ratio (ref 0.0–4.4)
Cholesterol, Total: 253 mg/dL — ABNORMAL HIGH (ref 100–199)
HDL: 68 mg/dL (ref >39)
LDL Chol Calc (NIH): 171 mg/dL — ABNORMAL HIGH (ref 0–99)
Triglycerides: 85 mg/dL (ref 0–149)
VLDL Cholesterol Cal: 14 mg/dL (ref 5–40)

## 2023-09-11 LAB — TSH: TSH: 0.778 u[IU]/mL (ref 0.450–4.500)

## 2023-09-11 LAB — VITAMIN D 25 HYDROXY (VIT D DEFICIENCY, FRACTURES): Vit D, 25-Hydroxy: 27.1 ng/mL — ABNORMAL LOW (ref 30.0–100.0)
